# Patient Record
Sex: Male | Born: 1995 | Race: White | Hispanic: No | Marital: Married | Smoking: Never smoker
Health system: Southern US, Community
[De-identification: ages and names within clinical notes are randomized; demographics above are authoritative.]

## PROBLEM LIST (undated history)

## (undated) DIAGNOSIS — F909 Attention-deficit hyperactivity disorder, unspecified type: Secondary | ICD-10-CM

## (undated) HISTORY — DX: Attention-deficit hyperactivity disorder, unspecified type: F90.9

## (undated) HISTORY — PX: TONSILLECTOMY: SUR1361

---
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
## 2017-01-17 ENCOUNTER — Encounter: Payer: Self-pay | Admitting: Family

## 2017-01-17 ENCOUNTER — Other Ambulatory Visit: Payer: Self-pay | Admitting: Pediatrics

## 2017-01-17 ENCOUNTER — Ambulatory Visit (INDEPENDENT_AMBULATORY_CARE_PROVIDER_SITE_OTHER): Payer: 59 | Admitting: Family

## 2017-01-17 VITALS — BP 98/62 | HR 72 | Resp 16 | Ht 69.0 in | Wt 132.6 lb

## 2017-01-17 DIAGNOSIS — R278 Other lack of coordination: Secondary | ICD-10-CM | POA: Diagnosis not present

## 2017-01-17 DIAGNOSIS — F902 Attention-deficit hyperactivity disorder, combined type: Secondary | ICD-10-CM | POA: Diagnosis not present

## 2017-01-17 DIAGNOSIS — Z719 Counseling, unspecified: Secondary | ICD-10-CM | POA: Diagnosis not present

## 2017-01-17 DIAGNOSIS — Z79899 Other long term (current) drug therapy: Secondary | ICD-10-CM

## 2017-01-17 MED ORDER — METHYLPHENIDATE HCL ER (OSM) 27 MG PO TBCR: 27.0000 mg | EXTENDED_RELEASE_TABLET | Freq: Every day | ORAL | 0 refills | Status: DC

## 2017-01-17 MED ORDER — ATOMOXETINE HCL 60 MG PO CAPS: 60.0000 mg | ORAL_CAPSULE | Freq: Every day | ORAL | 2 refills | Status: DC

## 2017-01-17 NOTE — Progress Notes (Signed)
[facility] [address] [place] [address] Dept: [phone] Dept Fax: [phone] Loc: [phone] Loc Fax: [phone]  Medical Follow-up  Patient ID: Antonio PoppBrandon Rice, male  DOB: 10-18-1995, 21 y.o.  MRN: [id number]  Date of Evaluation: 01/17/17  PCP: Junie SpencerHawks, Christy A, FNP  Accompanied by: Mother Patient Lives with: parents and brother  HISTORY/CURRENT STATUS:  HPI  Patient here for routine follow up related to ADHD, Dysgraphia, and medication management. Patient here with mother for today's visit. Mother in waiiting room for the majority of the visit.  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 Did report one family trip and friends over the house often. Has taken his Concerta and Strattera all summer with no reported side effects at current doses.   EDUCATION: [employer] Year/Grade: Sophomore year  Take History, Math, 2 Skills and Technique classes, fundamentals of Brewing technologistlementary teaching Performance/Grades: average Services: Other: Disability services-extra time for testing Activities/Exercise: intermittently- most days with PE classes  MEDICAL HISTORY: Appetite: Good-not big eater, but does eat well when he eats.  MVI/Other: None Fruits/Vegs:Some Calcium: Some Iron:Some  Sleep: Time per night is about 8-10 hours/this summer on most nights.  Sleep Concerns: Initiation/Maintenance/Other: No problems reported by patient  Individual Medical History/Review of System Changes? None reported recently. Patient not sick or injured this summer per his report.   Allergies: Patient has no known allergies.  Current Medications:  Current Outpatient Prescriptions:  .  atomoxetine (STRATTERA) 60 MG capsule, Take 1 capsule (60 mg total) by mouth daily., Disp:  30 capsule, Rfl: 2 .  methylphenidate 27 MG PO CR tablet, Take 1 tablet (27 mg total) by mouth daily with breakfast. 03/18/17, Disp: 30 tablet, Rfl: 0 Medication Side Effects: None  Family Medical/Social History Changes?: None reported recently by patient. Brother is finished with Chemotherapy and finishing his radiation treatment. Support given.   MENTAL HEALTH: Mental Health Issues: No problems reported.   PHYSICAL EXAM: Vitals:  Today's Vitals   01/17/17 1138  BP: 98/62  Pulse: 72  Resp: 16  Weight: 132 lb 9.6 oz (60.1 kg)  Height: 5\' 9"  (1.753 m)  PainSc: 0-No pain  , 11 %ile (Z= -1.23) based on CDC 2-20 Years BMI-for-age data using vitals from 01/17/2017.  General Exam: Physical Exam  Constitutional: He is oriented to person, place, and time. He appears well-developed and well-nourished.  HENT:  Head: Normocephalic and atraumatic.  Right Ear: External ear normal.  Left Ear: External ear normal.  Nose: Nose normal.  Mouth/Throat: Oropharynx is clear and moist.  Eyes: Pupils are equal, round, and reactive to light. Conjunctivae and EOM are normal.  Neck: Trachea normal, normal range of motion and full passive range of motion without pain. Neck supple.  Cardiovascular: Normal rate, regular rhythm, normal heart sounds and intact distal pulses.   Pulmonary/Chest: Effort normal and breath sounds normal.  Abdominal: Soft. Bowel sounds are normal.  Genitourinary:  Genitourinary Comments: Deferred  Musculoskeletal: Normal range of motion.  Neurological: He is alert and oriented to person, place, and time. He has normal reflexes.  Skin: Skin is warm, dry and intact. Capillary refill takes less than 2 seconds.  Psychiatric: He has a normal mood and affect. His behavior is normal. Judgment and thought content normal.  Vitals reviewed.  Review of Systems  Psychiatric/Behavioral: Positive for decreased concentration.  All other systems reviewed and are negative.  No concerns for  toileting. Daily stool, no constipation or diarrhea. Void urine no difficulty. No enuresis.   Participate in daily oral hygiene to include brushing and flossing.  Neurological: oriented to time, place, and person Cranial Nerves: normal  Neuromuscular:  Motor Mass: Normal Tone: Normal Strength: Normal DTRs: 2+ and symmetric Overflow: None  Reflexes: no tremors noted Sensory Exam: Vibratory: Intact  Fine Touch: Intact  Testing/Developmental Screens: CGI:18/14 scored by patient and counseled at today's visit.      DIAGNOSES:    ICD-10-CM   1. ADHD (attention deficit hyperactivity disorder), combined type F90.2   2. Dysgraphia R27.8   3. Medication management Z79.899   4. Patient counseled Z71.9     RECOMMENDATIONS: 3 month follow up visit and counseled on medication. To continue with Strattera 60 mg daily, # 30 with 2 RF's, Concerta 27 mg daily, # 30 script printed. Three prescriptions provided, two with fill after dates for 02/17/17. and 03/19/17.  Information reviewed with patient regarding school schedule, classes for this semester and expectations from mother regarding grade performance.   Encouraged patient to be more involved socially at school with attendance to football or basketball games to show school spirit. Mother wanting to reinforce this without directing him to do it.   Counseled patient on physical activity and this semester will take several PE classes for this major along with intermural basketball at school.   Suggested better eating habits with a routine schedule once at school. Patient mostly eating 2 meals/day with snacking most of the day. Encouraged to eat healthy, high protein snacks.   Information received regarding brother's diagnosis, treatment, and prognosis with patient and mother. Support given to family.  Directed to f/u with PCP for routine care, dentist as needed, eye doctor for routine care, MVI daily, proper sleep, exercise, and healthy diet for  health maintenance.   NEXT APPOINTMENT: Return in about 3 months (around 04/19/2017) for follow up visit.   More than 50% of the appointment was spent counseling and discussing diagnosis and management of symptoms with the patient and family.  Carron Curie, NP Counseling Time: 30  mins Total Contact Time: 40 mins

## 2017-04-16 ENCOUNTER — Telehealth: Payer: Self-pay | Admitting: Family

## 2017-04-16 MED ORDER — METHYLPHENIDATE HCL ER (OSM) 27 MG PO TBCR: 27.0000 mg | EXTENDED_RELEASE_TABLET | Freq: Every day | ORAL | 0 refills | Status: DC

## 2017-04-16 NOTE — Telephone Encounter (Signed)
Printed Rx and placed at front desk for pick-up-Post dated for Concerta 27 mg daily 04/15/17 & 05/15/17.

## 2017-05-24 ENCOUNTER — Encounter: Payer: Self-pay | Admitting: Pediatrics

## 2017-05-24 ENCOUNTER — Ambulatory Visit: Payer: 59 | Admitting: Pediatrics

## 2017-05-24 VITALS — BP 120/80 | Ht 69.0 in | Wt 139.0 lb

## 2017-05-24 DIAGNOSIS — Z7189 Other specified counseling: Secondary | ICD-10-CM

## 2017-05-24 DIAGNOSIS — F902 Attention-deficit hyperactivity disorder, combined type: Secondary | ICD-10-CM

## 2017-05-24 DIAGNOSIS — R278 Other lack of coordination: Secondary | ICD-10-CM

## 2017-05-24 DIAGNOSIS — Z79899 Other long term (current) drug therapy: Secondary | ICD-10-CM

## 2017-05-24 DIAGNOSIS — Z719 Counseling, unspecified: Secondary | ICD-10-CM | POA: Diagnosis not present

## 2017-05-24 MED ORDER — ATOMOXETINE HCL 60 MG PO CAPS: 60.0000 mg | ORAL_CAPSULE | Freq: Every day | ORAL | 5 refills | Status: DC

## 2017-05-24 MED ORDER — METHYLPHENIDATE HCL ER (OSM) 27 MG PO TBCR: 27.0000 mg | EXTENDED_RELEASE_TABLET | Freq: Every day | ORAL | 0 refills | Status: DC

## 2017-05-24 NOTE — Patient Instructions (Addendum)
DISCUSSION: Patient and family counseled regarding the following coordination of care items:  Continue medication as directed Strattera 80 mg every morning RX for above e-scribed and sent to pharmacy on record  Concerta 27 mg every morning Three prescriptions provided, two with fill after dates for 06/14/2017 and 07/05/2017  Counseled medication administration, effects, and possible side effects.  ADHD medications discussed to include different medications and pharmacologic properties of each. Recommendation for specific medication to include dose, administration, expected effects, possible side effects and the risk to benefit ratio of medication management.  Advised importance of:  Good sleep hygiene (8- 10 hours per night) Limited screen time (none on school nights, no more than 2 hours on weekends) Regular exercise(outside and active play) Healthy eating (drink water, no sodas/sweet tea, limit portions and no seconds).  Counseling at this visit included the review of old records and/or current chart with the patient and family.   Counseling included the following discussion points:  Recent health history and today's examination Growth and development with anticipatory guidance provided regarding brain growth, executive function maturation and pubertal development School progress and continued advocay for appropriate accommodations to include maintain Structure, routine, organization, reward, motivation and consequences.  Additionally discussed need for counseling with regard to family situation. Please reach out to hospice to coordinate counseling for family.

## 2017-05-24 NOTE — Progress Notes (Signed)
[facility] [address], [address] [place] [address] Dept: [phone] Dept Fax: [phone] Loc: [phone] Loc Fax: [phone]  Medical Follow-up  Patient ID: Antonio PoppBrandon Rice, male  DOB: [birth year], 21 y.o.  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 Came up spine, lost weight and may be in hospice.  Father is now in separate home about four months ago. Antonio RuskJeremy (17 years) is a family friend and living with Dad.  Antonio JunesBrandon feels he has a good relationship with him and can talk to him about stuff.  Is home from college now, and is mostly at [employer]'s house.  HISTORY/CURRENT STATUS:  Chief Complaint - Polite and cooperative and present for medical follow up for medication management of ADHD, dysgraphia and learning differences.  Strattera 80 mg, concerta 27 mg, take both daily.    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  MEDICAL HISTORY: Appetite: WNL  Sleep: Bedtime: good sleep Awakens: good Some late nights at home and waking up later Sleep Concerns: Initiation/Maintenance/Other: Asleep easily, sleeps  through the night, feels well-rested.  No Sleep concerns. No concerns for toileting. Daily stool, no constipation or diarrhea. Void urine no difficulty. No enuresis.   Participate in daily oral hygiene to include brushing and flossing.  Individual Medical History/Review of System Changes? No  Allergies: Patient has no known allergies.  Current Medications:   Strattera 80 mg Concerta 27 mg Daily medication  Medication Side Effects: None  Family Medical/Social History Changes?: Yes brother now in hospice care at home for recurring sarcoma that returned late September early October. Mother is taking care of brother and is still trying to work. Father is a mess.    MENTAL HEALTH: Mental Health Issues:  Very stressed with the family situation of the father being away and the brother with terminal cancer.  Denies sadness, loneliness or depression.  No self harm or thoughts of self harm or injury. Denies fears, worries and anxieties. Has good peer relations and is not a bully nor is victimized.  Review of Systems  Constitutional: Negative.   HENT: Negative.   Eyes: Negative.   Respiratory: Negative.   Cardiovascular: Negative.   Gastrointestinal: Negative.   Endocrine: Negative.   Genitourinary: Negative.   Musculoskeletal: Negative.   Allergic/Immunologic: Negative.   Neurological: Negative for seizures and headaches.  Hematological: Negative.   Psychiatric/Behavioral: Positive for dysphoric mood. Negative for behavioral problems, decreased concentration and sleep disturbance. The patient is not nervous/anxious and is not hyperactive.     PHYSICAL EXAM: Vitals:  Today's Vitals   05/24/17 1409  BP: 120/80  Weight: 139 lb (63 kg)  Height: 5\' 9"  (1.753 m)  , 19 %ile (Z= -0.86) based on CDC (Boys, 2-20  Years) BMI-for-age based on BMI available as of 05/24/2017. Body mass index is 20.53 kg/m.  General Exam: Physical Exam  Constitutional: He is oriented to person, place,  and time. Vital signs are normal. He appears well-developed and well-nourished. He is cooperative. No distress.  HENT:  Head: Normocephalic.  Right Ear: Tympanic membrane and ear canal normal.  Left Ear: Tympanic membrane and ear canal normal.  Nose: Nose normal.  Mouth/Throat: Uvula is midline, oropharynx is clear and moist and mucous membranes are normal.  Eyes: Conjunctivae, EOM and lids are normal. Pupils are equal, round, and reactive to light.  Neck: Normal range of motion. Neck supple. No thyromegaly present.  Cardiovascular: Normal rate, regular rhythm and intact distal pulses.  Pulmonary/Chest: Effort normal and breath sounds normal.  Abdominal: Soft. Normal appearance.  Genitourinary:  Genitourinary Comments: Deferred  Musculoskeletal: Normal range of motion.  Neurological: He is alert and oriented to person, place, and time. He has normal strength and normal reflexes. He displays no tremor. No cranial nerve deficit or sensory deficit. He exhibits normal muscle tone. He displays a negative Romberg sign. He displays no seizure activity. Coordination and gait normal.  Skin: Skin is warm, dry and intact.  Psychiatric: He has a normal mood and affect. His speech is normal and behavior is normal. Judgment and thought content normal. His mood appears not anxious. His affect is not inappropriate. He is not agitated, not aggressive and not hyperactive. Cognition and memory are normal. He does not express impulsivity or inappropriate judgment. He expresses no suicidal ideation. He expresses no suicidal plans. He is attentive.  Vitals reviewed.  Neurological: oriented to place and person  Testing/Developmental Screens: CGI:19/11  Reviewed with patient and mother     DIAGNOSES:    ICD-10-CM   1. ADHD (attention deficit hyperactivity disorder), combined type F90.2   2. Dysgraphia R27.8   3. Medication management Z79.899   4. Counseling and coordination of care Z71.89   5. Patient  counseled Z71.9   6. Parenting dynamics counseling Z71.89     RECOMMENDATIONS:  Patient Instructions  DISCUSSION: Patient and family counseled regarding the following coordination of care items:  Continue medication as directed Strattera 80 mg every morning RX for above e-scribed and sent to pharmacy on record  Concerta 27 mg every morning Three prescriptions provided, two with fill after dates for 06/14/2017 and 07/05/2017  Counseled medication administration, effects, and possible side effects.  ADHD medications discussed to include different medications and pharmacologic properties of each. Recommendation for specific medication to include dose, administration, expected effects, possible side effects and the risk to benefit ratio of medication management.  Advised importance of:  Good sleep hygiene (8- 10 hours per night) Limited screen time (none on school nights, no more than 2 hours on weekends) Regular exercise(outside and active play) Healthy eating (drink water, no sodas/sweet tea, limit portions and no seconds).  Counseling at this visit included the review of old records and/or current chart with the patient and family.   Counseling included the following discussion points:  Recent health history and today's examination Growth and development with anticipatory guidance provided regarding brain growth, executive function maturation and pubertal development School progress and continued advocay for appropriate accommodations to include maintain Structure, routine, organization, reward, motivation and consequences.  Additionally discussed need for counseling with regard to family situation. Please reach out to hospice to coordinate counseling for family.    NEXT APPOINTMENT: Return in about 6 months (around 11/22/2017) for Medical Follow up.  Medical Decision-making: More than 50% of the appointment was spent counseling and discussing diagnosis and management of symptoms  with the patient and family.   Leticia Penna, NP Counseling Time: 40 Total Contact Time: 50

## 2017-06-20 ENCOUNTER — Ambulatory Visit (INDEPENDENT_AMBULATORY_CARE_PROVIDER_SITE_OTHER): Payer: 59

## 2017-06-20 ENCOUNTER — Encounter: Payer: Self-pay | Admitting: Family Medicine

## 2017-06-20 ENCOUNTER — Ambulatory Visit: Payer: 59 | Admitting: Family Medicine

## 2017-06-20 VITALS — BP 111/72 | HR 107 | Temp 98.7°F | Ht 69.0 in | Wt 139.0 lb

## 2017-06-20 DIAGNOSIS — S6990XA Unspecified injury of unspecified wrist, hand and finger(s), initial encounter: Secondary | ICD-10-CM

## 2017-06-20 DIAGNOSIS — T148XXA Other injury of unspecified body region, initial encounter: Secondary | ICD-10-CM | POA: Diagnosis not present

## 2017-06-20 DIAGNOSIS — S6991XA Unspecified injury of right wrist, hand and finger(s), initial encounter: Secondary | ICD-10-CM

## 2017-06-20 NOTE — Progress Notes (Signed)
Subjective: CC: hand injury PCP: Junie SpencerHawks, Christy A, FNP ZOX:WRUEAVWHPI:Antonio Rice is a 22 y.o. male presenting to clinic today for:  1. Hand injury Patient reports that he hyperextended his middle finger on the right hand about 2 weeks ago.  He notes swelling and pain during that time.  He wrapped his hand and Ace bandage but continued to play and exercise.  He notes that he continues to have pain particularly with bowling up his fist.  He noticed some swelling over the joint in his middle finger.  No numbness, tingling.  No discoloration.  He is not taking any medications for this at this time.  Of note, he is taking a swim class and is worried that he may cause further injury to the hand if he swims.   ROS: Per HPI  No Known Allergies Past Medical History:  Diagnosis Date  . ADHD (attention deficit hyperactivity disorder)     Current Outpatient Medications:  .  atomoxetine (STRATTERA) 60 MG capsule, Take 1 capsule (60 mg total) by mouth daily., Disp: 30 capsule, Rfl: 5 .  methylphenidate 27 MG PO CR tablet, Take 1 tablet (27 mg total) by mouth daily with breakfast. 05/15/17, Disp: 30 tablet, Rfl: 0 Social History   Socioeconomic History  . Marital status: Single    Spouse name: Not on file  . Number of children: Not on file  . Years of education: Not on file  . Highest education level: Not on file  Social Needs  . Financial resource strain: Not on file  . Food insecurity - worry: Not on file  . Food insecurity - inability: Not on file  . Transportation needs - medical: Not on file  . Transportation needs - non-medical: Not on file  Occupational History  . Not on file  Tobacco Use  . Smoking status: Never Smoker  . Smokeless tobacco: Never Used  Substance and Sexual Activity  . Alcohol use: No    Alcohol/week: 0.0 oz  . Drug use: No  . Sexual activity: Not Currently  Other Topics Concern  . Not on file  Social History Narrative  . Not on file   Family History  Problem  Relation Age of Onset  . Cancer Brother 17       undifferentiated epitheliod malignancy high grade sarcoma    Objective: Office vital signs reviewed. BP 111/72   Pulse (!) 107   Temp 98.7 F (37.1 C) (Oral)   Ht 5\' 9"  (1.753 m)   Wt 139 lb (63 kg)   BMI 20.53 kg/m   Physical Examination:  General: Awake, alert, well nourished, No acute distress MSK: No erythema.  Patient has decreased active range of motion secondary to pain in flexion of the third digit in the right hand.  There is mild swelling noted near the PIP joint.  There is tenderness to the distal aspect of the third metacarpal near the joint.   Extremities: brisk capillary refill noted. Neuro: Light touch sensation grossly intact.  Minimal weakness appreciated with resisted flexion of the third digit on the right hand.  Dg Hand Complete Right  Result Date: 06/20/2017 CLINICAL DATA:  Injury while playing basketball EXAM: RIGHT HAND - COMPLETE 3+ VIEW COMPARISON:  None. FINDINGS: Frontal, oblique, and lateral views were obtained. There is an avulsion along the dorsal, medial aspect of the third distal metacarpal with alignment near anatomic. No other evident fracture. No dislocation. Joint spaces appear normal. No erosive change. IMPRESSION: Avulsion along the dorsal, medial  aspect of the third distal metacarpal with alignment near anatomic. No other fracture. No dislocation. No evident arthropathy. These results will be called to the ordering clinician or representative by the Radiologist Assistant, and communication documented in the PACS or zVision Dashboard. Electronically Signed   By: Bretta Bang III M.D.   On: 06/20/2017 15:15   Assessment/ Plan: 22 y.o. male   1. Injury of hand, unspecified laterality, initial encounter Physical exam notable for mild swelling at the PIP joint of the right hand and the third digit.  X-ray was obtained which did reveal a nondisplaced avulsion fracture along the distal third  metacarpal.  This does not appear to involve the joint and seems localized to the edge of the metacarpal head.  Patient was placed in a wrist brace today, with 30 degrees of extension.  He was referred to orthopedics for further evaluation and management.  I suspect this will likely be able to be managed with bracing.  I did write a note to his school to excuse him from swimming for now.  I recommended that he avoid impact activities at this time.  He may use ice and ibuprofen if needed for pain or swelling. - DG Hand Complete Right; Future - Ambulatory referral to Orthopedic Surgery  2. Avulsion fracture - Ambulatory referral to Orthopedic Surgery   Orders Placed This Encounter  Procedures  . DG Hand Complete Right    Standing Status:   Future    Number of Occurrences:   1    Standing Expiration Date:   08/19/2018    Order Specific Question:   Reason for Exam (SYMPTOM  OR DIAGNOSIS REQUIRED)    Answer:   injury    Order Specific Question:   Preferred imaging location?    Answer:   Internal    Order Specific Question:   Radiology Contrast Protocol - do NOT remove file path    Answer:   [email or web address]  . Ambulatory referral to Orthopedic Surgery    Referral Priority:   Urgent    Referral Type:   Surgical    Referral Reason:   Specialty Services Required    Requested Specialty:   Orthopedic Surgery    Number of Visits Requested:   1   No orders of the defined types were placed in this encounter.    Raliegh Ip, DO [facility] [phone]

## 2017-07-02 DIAGNOSIS — S62309A Unspecified fracture of unspecified metacarpal bone, initial encounter for closed fracture: Secondary | ICD-10-CM | POA: Insufficient documentation

## 2017-08-29 ENCOUNTER — Encounter: Payer: Self-pay | Admitting: Pediatrics

## 2017-09-09 ENCOUNTER — Other Ambulatory Visit: Payer: Self-pay

## 2017-09-09 MED ORDER — METHYLPHENIDATE HCL ER (OSM) 27 MG PO TBCR: 27.0000 mg | EXTENDED_RELEASE_TABLET | Freq: Every day | ORAL | 0 refills | Status: DC

## 2017-09-09 NOTE — Telephone Encounter (Signed)
Spoke with mom and informed her that we cant escribe additional RX to pharmacy but if she needs more refills to call and let her know and also let mom know that she can pick up meds from pharmacy

## 2017-09-09 NOTE — Telephone Encounter (Signed)
Mom called for refills for Concerta, needs refills for April and May patient is in college. Last visit 05/24/2017 next visit 10/18/2017. [facility] [place].

## 2017-09-09 NOTE — Telephone Encounter (Signed)
RX for above e-scribed and sent to pharmacy on record  [facility] - [address] [place] [place] [address] Phone: [phone] Fax: [phone]  I will request CMA call mother and tell her that we are not able to escribe additional RX.  We can print and provide paper or patient can call and we can escribe to another pharmacy for additional RX.

## 2017-10-16 ENCOUNTER — Other Ambulatory Visit: Payer: Self-pay

## 2017-10-16 MED ORDER — METHYLPHENIDATE HCL ER (OSM) 27 MG PO TBCR: 27.0000 mg | EXTENDED_RELEASE_TABLET | Freq: Every day | ORAL | 0 refills | Status: DC

## 2017-10-16 NOTE — Telephone Encounter (Signed)
Mom called in for refill for Methylphenidate . Last visit 06/20/2017 next visit 11/13/2017. Please escribe to [facility] in [place], [place]

## 2017-10-16 NOTE — Telephone Encounter (Signed)
RX for above e-scribed and sent to pharmacy on record  [facility] [place] - [address] [place] [address] Phone: [phone] Fax: [phone]    

## 2017-10-18 ENCOUNTER — Institutional Professional Consult (permissible substitution): Payer: 59 | Admitting: Pediatrics

## 2017-11-06 ENCOUNTER — Ambulatory Visit: Payer: 59 | Admitting: Family Medicine

## 2017-11-06 ENCOUNTER — Encounter: Payer: Self-pay | Admitting: Family Medicine

## 2017-11-06 VITALS — BP 117/77 | HR 112 | Temp 99.0°F | Ht >= 80 in | Wt 134.0 lb

## 2017-11-06 DIAGNOSIS — M25571 Pain in right ankle and joints of right foot: Secondary | ICD-10-CM | POA: Diagnosis not present

## 2017-11-06 NOTE — Progress Notes (Signed)
Subjective: CC: Right ankle  PCP: Antonio Spencer, FNP ZOX:WRUEAVW [name] is a 22 y.o. male presenting to clinic today for:  1. Right ankle pain Patient reports acute onset of right ankle pain yesterday evening while playing basketball.  He notes that he was going in for a lay up when he hyperextended and inverted the right foot.  He reports hearing an audible pop and has since had quite a bit of pain and swelling in the right lateral ankle.  He notes discoloration.  He reports pain with ambulation.  No numbness or tingling.     ROS: Per HPI  No Known Allergies Past Medical History:  Diagnosis Date  . ADHD (attention deficit hyperactivity disorder)     Current Outpatient Medications:  .  atomoxetine (STRATTERA) 60 MG capsule, Take 1 capsule (60 mg total) by mouth daily., Disp: 30 capsule, Rfl: 5 .  methylphenidate 27 MG PO CR tablet, Take 1 tablet (27 mg total) by mouth daily with breakfast., Disp: 30 tablet, Rfl: 0 Social History   Socioeconomic History  . Marital status: Single    Spouse name: Not on file  . Number of children: Not on file  . Years of education: Not on file  . Highest education level: Not on file  Occupational History  . Not on file  Social Needs  . Financial resource strain: Not on file  . Food insecurity:    Worry: Not on file    Inability: Not on file  . Transportation needs:    Medical: Not on file    Non-medical: Not on file  Tobacco Use  . Smoking status: Never Smoker  . Smokeless tobacco: Never Used  Substance and Sexual Activity  . Alcohol use: No    Alcohol/week: 0.0 oz  . Drug use: No  . Sexual activity: Not Currently  Lifestyle  . Physical activity:    Days per week: Not on file    Minutes per session: Not on file  . Stress: Not on file  Relationships  . Social connections:    Talks on phone: Not on file    Gets together: Not on file    Attends religious service: Not on file    Active member of club or organization: Not on  file    Attends meetings of clubs or organizations: Not on file    Relationship status: Not on file  . Intimate partner violence:    Fear of current or ex partner: Not on file    Emotionally abused: Not on file    Physically abused: Not on file    Forced sexual activity: Not on file  Other Topics Concern  . Not on file  Social History Narrative  . Not on file   Family History  Problem Relation Age of Onset  . Cancer Brother 17       undifferentiated epitheliod malignancy high grade sarcoma    Objective: Office vital signs reviewed. BP 117/77   Pulse (!) 112   Temp 99 F (37.2 C) (Oral)   Ht 7' (2.134 m)   Wt 134 lb (60.8 kg)   BMI 13.35 kg/m   Physical Examination:  General: Awake, alert, well nourished, well appearing male, No acute distress MSK:   Right ankle: Patient has a large amount of soft tissue swelling with palpable fluctuance along the right dorso lateral aspect of the ankle.  He is exquisitely tender to minimal palpation over the ATF.  He has substantial pain with inversion of  the foot.  No pain with eversion of the foot.  No substantial ligamentous laxity appreciated with anterior drawer testing. Skin: Slight ecchymosis of the foot appreciated. Neuro: Light touch sensation grossly intact.  Assessment/ Plan: 22 y.o. male   1. Acute right ankle pain Concern for possible ATF tear given "popping".  We discussed obtaining imaging studies here versus orthopedics.  Mother would like to defer to orthopedic since we are able to secure an appointment today at 2:15 PM. - Ambulatory referral to Orthopedic Surgery   Orders Placed This Encounter  Procedures  . Ambulatory referral to Orthopedic Surgery    Referral Priority:   Routine    Referral Type:   Surgical    Referral Reason:   Specialty Services Required    Requested Specialty:   Orthopedic Surgery    Number of Visits Requested:   1      Antonio Zwicker Hulen Skains, DO [facility] [phone]

## 2017-11-06 NOTE — Patient Instructions (Signed)
I have placed a referral to your orthopedist.  I think that this is likely a bad sprain but given your "popping sensation" and exquisite swelling, I cannot rule out a partial tear of the ATF.  Ankle Sprain An ankle sprain is a stretch or tear in one of the tough, fiber-like tissues (ligaments) in the ankle. The ligaments in your ankle help to hold the bones of the ankle together. What are the causes? This condition is often caused by stepping on or falling on the outer edge of the foot. What increases the risk? This condition is more likely to develop in people who play sports. What are the signs or symptoms? Symptoms of this condition include:  Pain in your ankle.  Swelling.  Bruising. Bruising may develop right after you sprain your ankle or 1-2 days later.  Trouble standing or walking, especially when you turn or change directions.  How is this diagnosed? This condition is diagnosed with a physical exam. During the exam, your health care provider will press on certain parts of your foot and ankle and try to move them in certain ways. X-rays may be taken to see how severe the sprain is and to check for broken bones. How is this treated? This condition may be treated with:  A brace. This is used to keep the ankle from moving until it heals.  An elastic bandage. This is used to support the ankle.  Crutches.  Pain medicine.  Surgery. This may be needed if the sprain is severe.  Physical therapy. This may help to improve the range of motion in the ankle.  Follow these instructions at home:  Rest your ankle.  Take over-the-counter and prescription medicines only as told by your health care provider.  For 2-3 days, keep your ankle raised (elevated) above the level of your heart as much as possible.  If directed, apply ice to the area: ? Put ice in a plastic bag. ? Place a towel between your skin and the bag. ? Leave the ice on for 20 minutes, 2-3 times a day.  If you were  given a brace: ? Wear it as directed. ? Remove it to shower or bathe. ? Try not to move your ankle much, but wiggle your toes from time to time. This helps to prevent swelling.  If you were given an elastic bandage (dressing): ? Remove it to shower or bathe. ? Try not to move your ankle much, but wiggle your toes from time to time. This helps to prevent swelling. ? Adjust the dressing to make it more comfortable if it feels too tight. ? Loosen the dressing if you have numbness or tingling in your foot, or if your foot becomes cold and blue.  If you have crutches, use them as told by your health care provider. Continue to use them until you can walk without feeling pain in your ankle. Contact a health care provider if:  You have rapidly increasing bruising or swelling.  Your pain is not relieved with medicine. Get help right away if:  Your toes or foot becomes numb or blue.  You have severe pain that gets worse. This information is not intended to replace advice given to you by your health care provider. Make sure you discuss any questions you have with your health care provider. Document Released: 05/28/2005 Document Revised: 07/05/2016 Document Reviewed: 12/28/2014 Elsevier Interactive Patient Education  [employer].

## 2017-11-13 ENCOUNTER — Ambulatory Visit: Payer: 59 | Admitting: Pediatrics

## 2017-11-13 ENCOUNTER — Encounter: Payer: Self-pay | Admitting: Pediatrics

## 2017-11-13 VITALS — BP 115/77 | HR 78 | Ht 70.5 in | Wt 140.0 lb

## 2017-11-13 DIAGNOSIS — R278 Other lack of coordination: Secondary | ICD-10-CM

## 2017-11-13 DIAGNOSIS — Z79899 Other long term (current) drug therapy: Secondary | ICD-10-CM

## 2017-11-13 DIAGNOSIS — Z7189 Other specified counseling: Secondary | ICD-10-CM | POA: Diagnosis not present

## 2017-11-13 DIAGNOSIS — F902 Attention-deficit hyperactivity disorder, combined type: Secondary | ICD-10-CM | POA: Diagnosis not present

## 2017-11-13 DIAGNOSIS — Z719 Counseling, unspecified: Secondary | ICD-10-CM

## 2017-11-13 MED ORDER — METHYLPHENIDATE HCL ER (OSM) 27 MG PO TBCR: 27.0000 mg | EXTENDED_RELEASE_TABLET | Freq: Every day | ORAL | 0 refills | Status: DC

## 2017-11-13 MED ORDER — ATOMOXETINE HCL 60 MG PO CAPS: 60.0000 mg | ORAL_CAPSULE | Freq: Every day | ORAL | 5 refills | Status: DC

## 2017-11-13 NOTE — Patient Instructions (Addendum)
DISCUSSION: Patient and family counseled regarding the following coordination of care items:  Continue medication as directed Concerta 27 mg every morning Strattera 60 mg every morning RX for above e-scribed and sent to pharmacy on record  [facility] [place] - [address] [place] [place] [address] Phone: [phone] Fax: [phone]  Counseled medication administration, effects, and possible side effects.  ADHD medications discussed to include different medications and pharmacologic properties of each. Recommendation for specific medication to include dose, administration, expected effects, possible side effects and the risk to benefit ratio of medication management.  Advised importance of:  Good sleep hygiene (8- 10 hours per night) Limited screen time (none on school nights, no more than 2 hours on weekends) Regular exercise(outside and active play) Healthy eating (drink water, no sodas/sweet tea, limit portions and no seconds).  Counseling at this visit included the review of old records and/or current chart with the patient and family.   Counseling included the following discussion points presented at every visit to improve understanding and treatment compliance.  Recent health history and today's examination Growth and development with anticipatory guidance provided regarding brain growth, executive function maturation and pubertal development School progress and continued advocay for appropriate accommodations to include maintain Structure, routine, organization, reward, motivation and consequences.  Additionally the patient was counseled to take medication while driving.

## 2017-11-13 NOTE — Progress Notes (Signed)
[facility] [place] [facility] [address], [address] [place] [address] Dept: [phone] Dept Fax: [phone] Loc: [phone] Loc Fax: [phone]  Medical Follow-up  Patient ID: Antonio PoppBrandon Dondero, male  DOB: January 01, 1996, 22 y.o.  MRN: [id number]  Date of Evaluation: 11/13/17   PCP: Junie SpencerHawks, Christy A, FNP  Accompanied by: Mother Patient Lives with: mother and father  Rotating weeks with parents Brother died in December from cancer at at 17 years, just before Christmas  Father lives with friend Riki RuskJeremy (17) Mother lives alone, mother considering moving from home where her son had passed  HISTORY/CURRENT STATUS:  Chief Complaint - Polite and cooperative and present for medical follow up for medication management of ADHD, dysgraphia and learning differences. Last follow up December 2018, currently prescribed Concerta 27 mg every morning, and Strattera 60 mg daily.  Recent basketball injury to ankle right and in boot. No pain today. No counseling, denies he needs grief counsel due to brother's passing in December at age 22 years from cancer.  Not sure if parents are in counseling and "they don't talk about it".   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 took: Hotel managerwimming (A), leisure activities (B), Eng (B), secondary ed (B) and psychology (F), intro to athletic health care (A)  Will take: Intro to Agricultural consultantTeaching, Public house managerducational Tech, Administrator, Civil Servicewater management and ecology,  history, Understanding music, Sociology  Wants to do physical education  Outside and intramural basket ball On off church Was working at [employer] - not working due to [employer]me[employer] Corporationboot, will take off one month [place]  MEDICAL HISTORY: Appetite: WNL  Sleep: Bedtime: 2200-2300 Awakens: 0800 to 0900 Sleep Concerns: Initiation/Maintenance/Other: Asleep easily, sleeps  through the night, feels well-rested.  No Sleep concerns. No concerns for toileting. Daily stool, no constipation or diarrhea. Void urine no difficulty. No enuresis.   Participate in daily oral hygiene to include brushing and flossing.  Individual Medical History/Review of System Changes? Yes ortho and urgent care for thumb in care door  Allergies: Patient has no known allergies.  Current Medications:  Strattera 60 mg Concerta 27 mg daily Medication Side Effects: None  Family Medical/Social History Changes?: Yes brother's death in December  MENTAL HEALTH: Denies sadness, loneliness or depression. No self harm or thoughts of self harm or injury. Denies fears, worries and anxieties. Has good peer relations and is not a bully nor is victimized.  PHYSICAL EXAM: Vitals:  Today's Vitals   11/13/17 1057  BP: 115/77  Pulse: 78  Weight: 140 lb (63.5 kg)  Height: 5' 10.5" (1.791 m)  , Facility age limit for growth percentiles is 20 years.  General Exam: Physical Exam  Constitutional: He is oriented to person, place, and time. Vital signs are normal. He appears well-developed and well-nourished. He is cooperative. No distress.  HENT:  Head: Normocephalic.  Right Ear: Tympanic membrane and ear canal normal.  Left Ear: Tympanic membrane and ear canal normal.  Nose: Nose normal.  Mouth/Throat: Uvula is midline, oropharynx is clear and moist and mucous membranes are normal.  Eyes: Pupils are equal, round, and reactive to light. Conjunctivae, EOM and lids are normal.  Neck: Normal range of motion. Neck supple. No thyromegaly present.  Cardiovascular: Normal rate, regular rhythm and intact distal pulses.  Pulmonary/Chest: Effort normal and breath sounds normal.  Abdominal: Soft. Normal appearance.  Genitourinary:  Genitourinary Comments: Deferred  Musculoskeletal: Normal range of motion.  Neurological: He is alert and oriented to person, place, and time. He has normal strength and  normal reflexes. He displays no tremor. No cranial nerve deficit or sensory deficit. He exhibits normal muscle tone. He displays a negative Romberg sign. He displays no seizure activity. Coordination and gait normal.  Skin: Skin is warm, dry and intact.  Psychiatric: He has a normal mood and affect. His speech is normal and behavior is normal. Judgment and thought content normal. His mood appears not anxious. His affect is not inappropriate. He is not agitated, not aggressive and not hyperactive. Cognition and memory are normal. He does not express impulsivity or inappropriate judgment. He expresses no suicidal ideation. He expresses no suicidal plans. He is attentive.  Vitals reviewed.   Neurological: oriented to place and person  Testing/Developmental Screens: CGI:15/11  Reviewed with patient and mother    DIAGNOSES:    ICD-10-CM   1. ADHD (attention deficit hyperactivity disorder), combined type F90.2   2. Dysgraphia R27.8   3. Medication management Z79.899   4. Patient counseled Z71.9   5. Parenting dynamics counseling Z71.89   6. Counseling and coordination of care Z71.89     RECOMMENDATIONS:  Patient Instructions  DISCUSSION: Patient and family counseled regarding the following coordination of care items:  Continue medication as directed Concerta 27 mg every morning Strattera 60 mg every morning RX for above e-scribed and sent to pharmacy on record  [facility] [place], [place] - [place], [place] - [address] [place] [address] Phone: [phone] Fax: [phone]  Counseled medication administration, effects, and possible side effects.  ADHD medications discussed to include different medications and pharmacologic properties of each. Recommendation for specific medication to include dose, administration, expected effects, possible side effects and the risk to benefit ratio of medication management.  Advised importance of:  Good sleep  hygiene (8- 10 hours per night) Limited screen time (none on school nights, no more than 2 hours on weekends) Regular exercise(outside and active play) Healthy eating (drink water, no sodas/sweet tea, limit portions and no seconds).  Counseling at this visit included the review of old records and/or current chart with the patient and family.   Counseling included the following discussion points presented at every visit to improve understanding and treatment compliance.  Recent health history and today's examination Growth and development with anticipatory guidance provided regarding brain growth, executive function maturation and pubertal development School progress and continued advocay for appropriate accommodations to include maintain Structure, routine, organization, reward, motivation and consequences.  Additionally the patient was counseled to take medication while driving.  Mother verbalized understanding of all topics discussed.   NEXT APPOINTMENT: Return in about 6 months (around 05/15/2018) for Medical Follow up. Medical Decision-making: More than 50% of the appointment was spent counseling and discussing diagnosis and management of symptoms with the patient and family.   Leticia Penna, NP Counseling Time: 40 Total Contact Time: 50

## 2017-12-10 ENCOUNTER — Other Ambulatory Visit: Payer: Self-pay | Admitting: Pediatrics

## 2017-12-10 NOTE — Telephone Encounter (Signed)
Last visit 11/13/2017 next visit 02/26/2018 

## 2018-01-15 ENCOUNTER — Other Ambulatory Visit: Payer: Self-pay | Admitting: Pediatrics

## 2018-01-15 NOTE — Telephone Encounter (Signed)
Last visit 11/13/2017 next visit 02/26/2018 

## 2018-01-16 NOTE — Telephone Encounter (Signed)
RX for above e-scribed and sent to pharmacy on record  [facility], [place] - [place], [place] - [address] [place] [address] Phone: [phone] Fax: [phone]    

## 2018-02-06 ENCOUNTER — Other Ambulatory Visit: Payer: Self-pay | Admitting: Pediatrics

## 2018-02-06 NOTE — Telephone Encounter (Signed)
Last visit 11/13/2017 next visit 02/26/2018

## 2018-02-07 NOTE — Telephone Encounter (Signed)
Concerta 27 mg daily, # 30 with no refills. RX for above e-scribed and sent to pharmacy on record  [facility], [place] - [place], [place] - [address] [place] [address] Phone: [phone] Fax: [phone]    

## 2018-02-26 ENCOUNTER — Institutional Professional Consult (permissible substitution): Payer: 59 | Admitting: Pediatrics

## 2018-03-11 ENCOUNTER — Other Ambulatory Visit: Payer: Self-pay | Admitting: Family

## 2018-03-11 NOTE — Telephone Encounter (Signed)
RX for above e-scribed and sent to pharmacy on record  [facility], [place] - [place], [place] - [address] [place] [address] Phone: [phone] Fax: [phone]    

## 2018-04-09 ENCOUNTER — Other Ambulatory Visit: Payer: Self-pay | Admitting: Pediatrics

## 2018-05-12 ENCOUNTER — Other Ambulatory Visit: Payer: Self-pay | Admitting: Pediatrics

## 2018-05-12 NOTE — Telephone Encounter (Signed)
Last visit 11/13/2017 next visit 05/22/2018

## 2018-05-12 NOTE — Telephone Encounter (Signed)
E-Prescribed Concerta 27 mg directly to  [facility] [place] - [address] [place] [address] Phone: [phone] Fax: [phone]

## 2018-05-22 ENCOUNTER — Encounter: Payer: Self-pay | Admitting: Pediatrics

## 2018-05-22 ENCOUNTER — Ambulatory Visit: Payer: 59 | Admitting: Pediatrics

## 2018-05-22 VITALS — BP 109/79 | HR 88 | Ht 69.5 in | Wt 138.0 lb

## 2018-05-22 DIAGNOSIS — Z79899 Other long term (current) drug therapy: Secondary | ICD-10-CM

## 2018-05-22 DIAGNOSIS — Z719 Counseling, unspecified: Secondary | ICD-10-CM

## 2018-05-22 DIAGNOSIS — F902 Attention-deficit hyperactivity disorder, combined type: Secondary | ICD-10-CM | POA: Diagnosis not present

## 2018-05-22 DIAGNOSIS — R278 Other lack of coordination: Secondary | ICD-10-CM | POA: Diagnosis not present

## 2018-05-22 DIAGNOSIS — Z7189 Other specified counseling: Secondary | ICD-10-CM

## 2018-05-22 NOTE — Progress Notes (Signed)
Medication Check  Patient ID: Antonio Rice  DOB: [birth year] MRN: [id number]  DATE:05/22/18 Junie Spencer, FNP  Accompanied by: Mother Patient Lives with: mother and father switches every week when home from school At school lives with roommate [name] on [address] in suites, has two suitemates.  Suite is two rooms, one bathroom and no common area/no living room Father living alone now. Mother has new house.  HISTORY/CURRENT STATUS: Chief Complaint - Polite and cooperative and present for medical follow up for medication management of ADHD, dysgraphia and learning differences. Last follow up June 2019 and currently prescribed Strattera 60 mg and Concerta 27 mg, takes both daily.  Currently finished sophomore first semester at [employer] and is not going back.  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 Parents do support his decision.  Has completely moved out and has considered [employer], but is still "researching" all of it.  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 Has car at school and was home every weekend.  MEDICAL HISTORY: Appetite: At schoolHas meal plan, goes to grill at school.  Takes snacks in AM, lunch around 1200 - burger, sandwiches, tender. Sometimes will snack in afternoon, not eating dinner, may go out with family or friends if in town, but not often. At home - first meal at lunch - sandwich or what he makes himself - snacks in between and then dinner - out with Dad, mom cooks some   Sleep: Bedtime: 2300 to 2400  Awakens: up usually around 1000   Concerns: Initiation/Maintenance/Other: Asleep easily, sleeps through the night, feels well-rested.  No Sleep concerns. No concerns for toileting. Daily stool, no constipation or  diarrhea. Void urine no difficulty. No enuresis.   Participate in daily oral hygiene to include brushing and flossing.  Individual Medical History/ Review of Systems: Changes? :No  Family Medical/ Social History: Changes? Year anniversary of brothers passing. Mother moved to new house in [place]  Current Medications:  Strattera 60 mg Concerta 27 mg  Medication Side Effects: None  MENTAL HEALTH: Mental Health Issues:   Denies sadness, loneliness or depression. No self harm or thoughts of self harm or injury. Denies fears, worries and anxieties. Has good peer relations and is not a bully nor is victimized.  Review of Systems  Constitutional: Negative.   HENT: Negative.   Eyes: Negative.   Respiratory: Negative.   Cardiovascular: Negative.   Gastrointestinal: Negative.   Endocrine: Negative.   Genitourinary: Negative.   Musculoskeletal: Negative.   Allergic/Immunologic: Negative.   Neurological: Negative for seizures and headaches.  Hematological: Negative.   Psychiatric/Behavioral: Positive for dysphoric mood. Negative for behavioral problems, decreased concentration and sleep disturbance. The patient is not nervous/anxious and is not hyperactive.    PHYSICAL EXAM; Vitals:   05/22/18 1111  BP: 109/79  Pulse: 88  Weight: 138 lb (62.6 kg)  Height: 5' 9.5" (1.765 m)   Body mass index is 20.09 kg/m.  General Physical Exam: Unchanged from previous exam, date:11/13/2017   Testing/Developmental Screens: CGI/ASRS = 14/13 Reviewed with patient and mother Counseled regarding need to continue to move toward "adulting".  At risk for living at home and not progressing developmentally.       DIAGNOSES:    ICD-10-CM   1. ADHD (attention  deficit hyperactivity disorder), combined type F90.2   2. Dysgraphia R27.8   3. Medication management Z79.899   4. Patient counseled Z71.9   5. Parenting dynamics counseling Z71.89   6. Counseling and coordination of care Z71.89      RECOMMENDATIONS:  Patient Instructions  DISCUSSION: Patient and family counseled regarding the following coordination of care items:  Continue medication as directed Concerta 27 mg every morning Strattera 60 mg every morning Counseled medication administration, effects, and possible side effects.  ADHD medications discussed to include different medications and pharmacologic properties of each. Recommendation for specific medication to include dose, administration, expected effects, possible side effects and the risk to benefit ratio of medication management.  Advised importance of:  Good sleep hygiene (8- 10 hours per night) Limited screen time (none on school nights, no more than 2 hours on weekends) Regular exercise(outside and active play) Healthy eating (drink water, no sodas/sweet tea, limit portions and no seconds).  Counseling at this visit included the review of old records and/or current chart with the patient and family.   Counseling included the following discussion points presented at every visit to improve understanding and treatment compliance.  Recent health history and today's examination Growth and development with anticipatory guidance provided regarding brain growth, executive function maturation and pubertal development School progress and continued advocay for appropriate accommodations to include maintain Structure, routine, organization, reward, motivation and consequences.  Mother verbalized understanding of all topics discussed.  NEXT APPOINTMENT:  Return in about 3 months (around 08/21/2018) for Medical Follow up.  Medical Decision-making: More than 50% of the appointment was spent counseling and discussing diagnosis and management of symptoms with the patient and family.  Counseling Time: 25 minutes Total Contact Time: 30 minutes

## 2018-05-22 NOTE — Patient Instructions (Addendum)
DISCUSSION: Patient and family counseled regarding the following coordination of care items:  Continue medication as directed Concerta 27 mg every morning Strattera 60 mg every morning Counseled medication administration, effects, and possible side effects.  ADHD medications discussed to include different medications and pharmacologic properties of each. Recommendation for specific medication to include dose, administration, expected effects, possible side effects and the risk to benefit ratio of medication management.  Advised importance of:  Good sleep hygiene (8- 10 hours per night) Limited screen time (none on school nights, no more than 2 hours on weekends) Regular exercise(outside and active play) Healthy eating (drink water, no sodas/sweet tea, limit portions and no seconds).  Counseling at this visit included the review of old records and/or current chart with the patient and family.   Counseling included the following discussion points presented at every visit to improve understanding and treatment compliance.  Recent health history and today's examination Growth and development with anticipatory guidance provided regarding brain growth, executive function maturation and pubertal development School progress and continued advocay for appropriate accommodations to include maintain Structure, routine, organization, reward, motivation and consequences.

## 2018-05-26 ENCOUNTER — Institutional Professional Consult (permissible substitution): Payer: 59 | Admitting: Pediatrics

## 2018-06-12 ENCOUNTER — Other Ambulatory Visit: Payer: Self-pay | Admitting: Pediatrics

## 2018-06-13 NOTE — Telephone Encounter (Signed)
Concerta 27 mg daily, # 30 with no refills. RX for above e-scribed and sent to pharmacy on record  [facility] [place], [place] - [place], [place] - [address] [place] [address] Phone: [phone] Fax: [phone]

## 2018-06-13 NOTE — Telephone Encounter (Signed)
Last visit 05/22/2018 next visit 11/11/2018 

## 2018-07-14 ENCOUNTER — Other Ambulatory Visit: Payer: Self-pay | Admitting: Family

## 2018-07-14 NOTE — Telephone Encounter (Signed)
Last visit 05/22/2018 next visit 11/11/2018

## 2018-07-14 NOTE — Telephone Encounter (Signed)
E-Prescribed Concerta 27 mg Q AM directly to  [facility], [place] - [place], [place] - [address] [place] [address] Phone: [phone] Fax: [phone]

## 2018-07-20 IMAGING — DX DG HAND COMPLETE 3+V*R*
3 series · 3 of 3 positions shown · non-contrast
Comparison: None.

CLINICAL DATA: Injury while playing basketball

EXAM:
RIGHT HAND - COMPLETE 3+ VIEW

[hand pa]
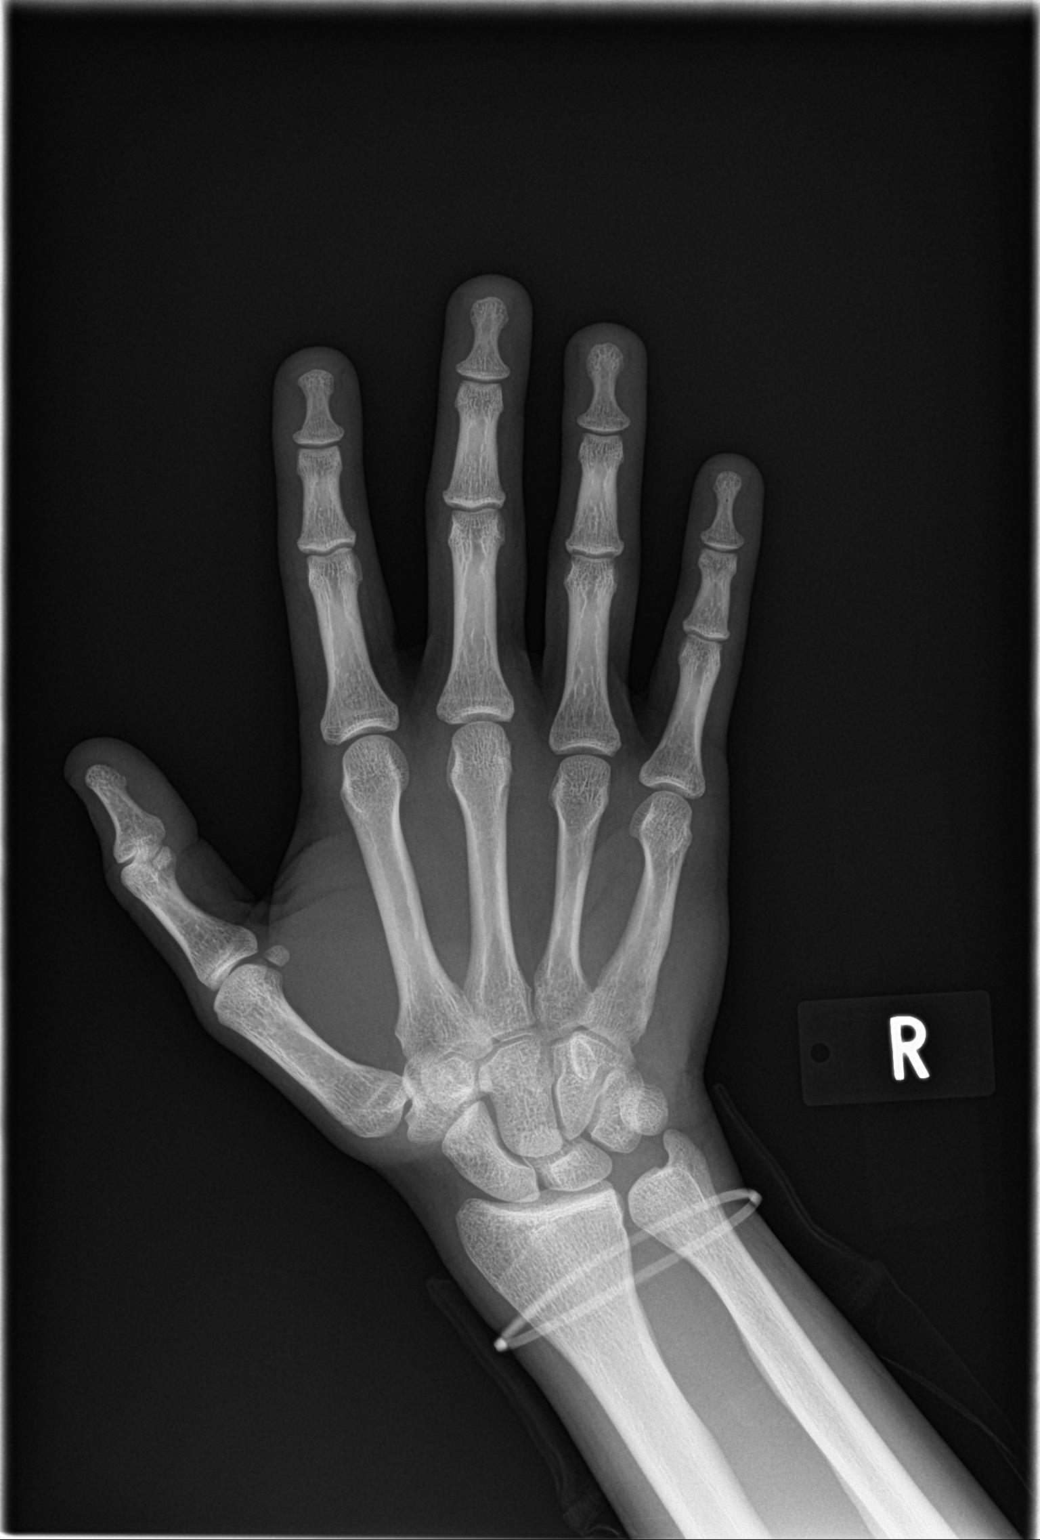

[hand obl]
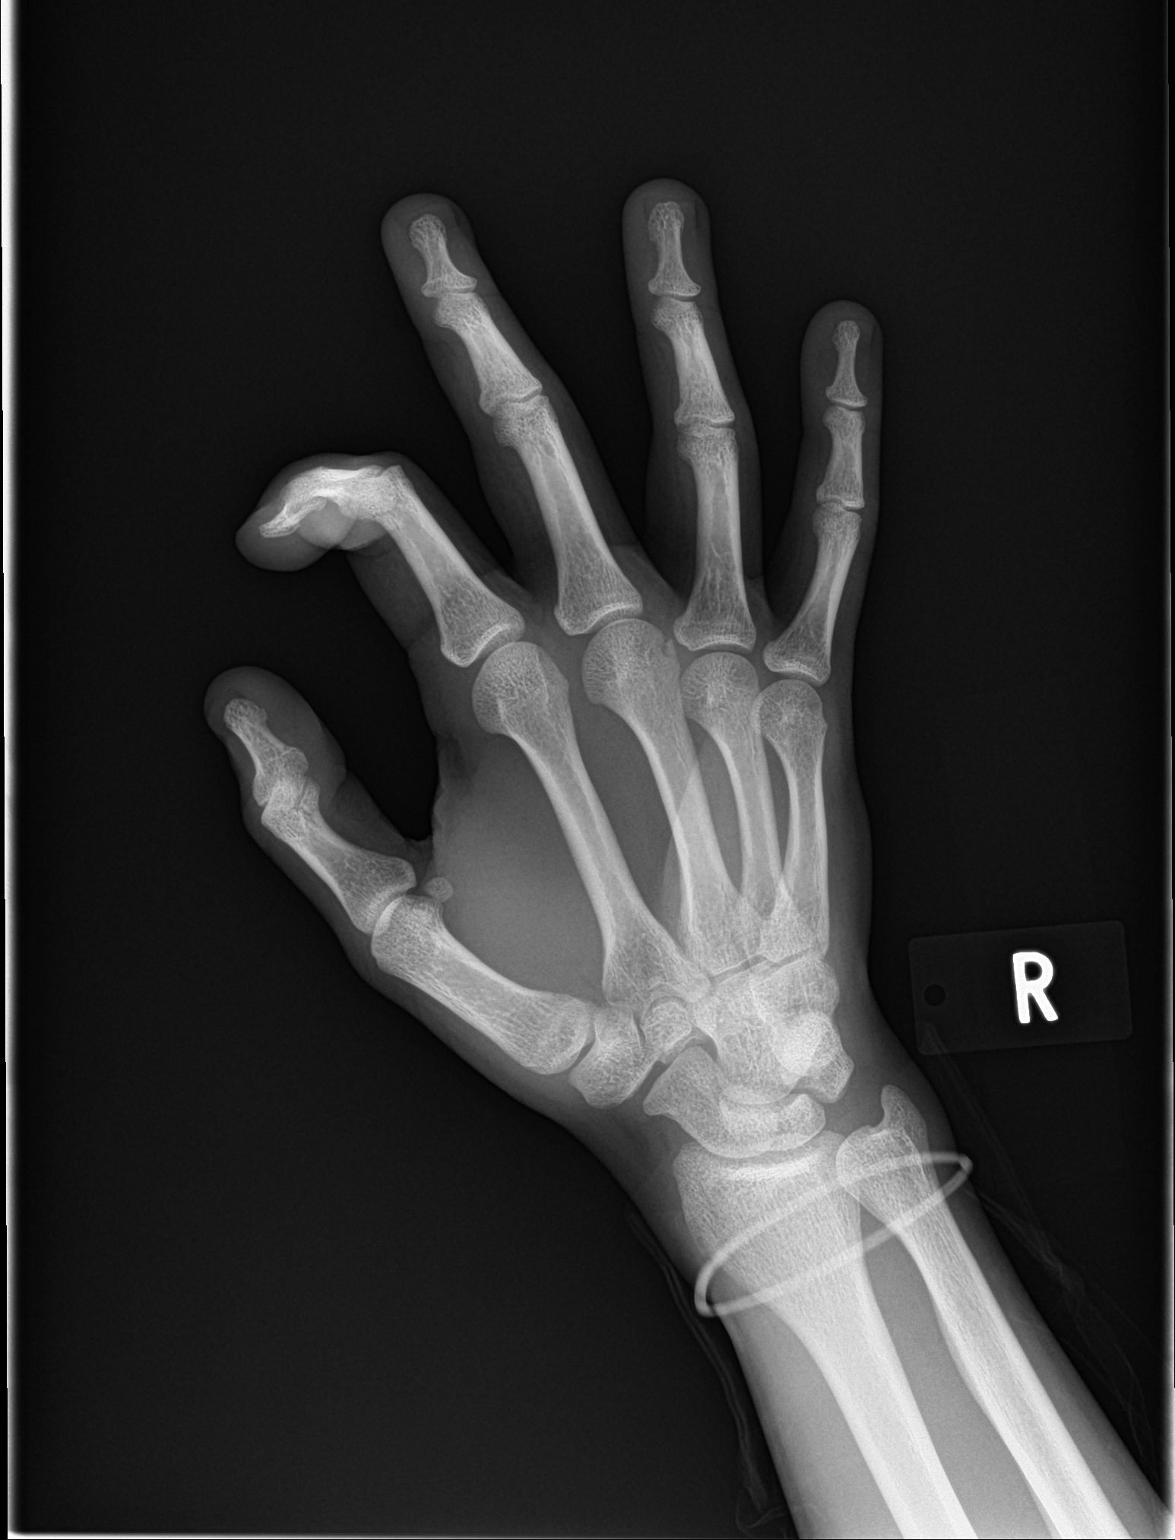

[hand lat]
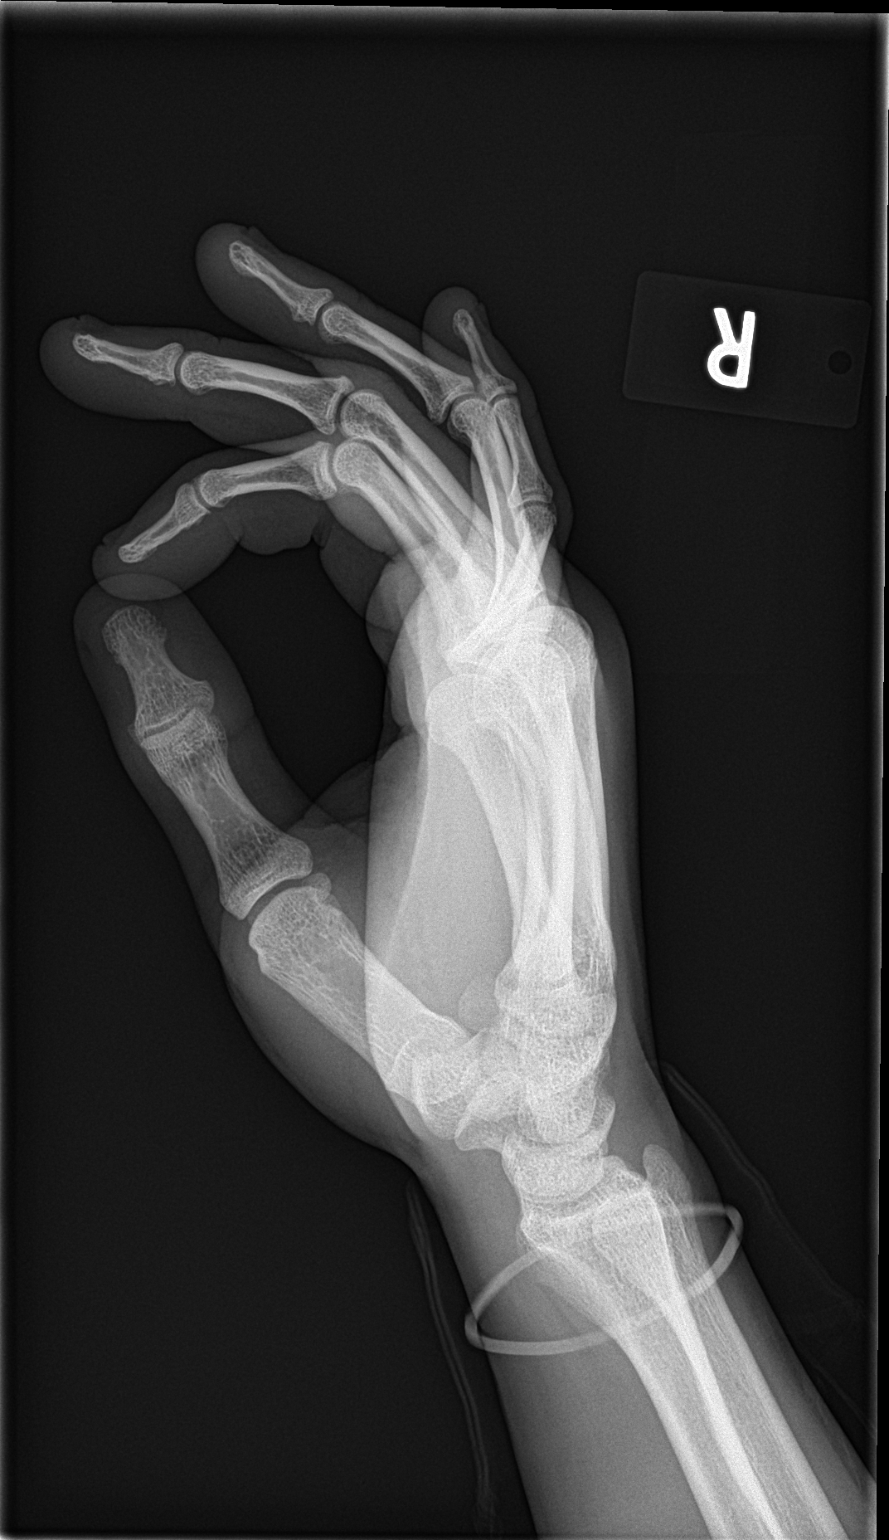

[3 of 3 positions shown; findings below may reference images not displayed]

FINDINGS: Frontal, oblique, and lateral views were obtained. There is an
avulsion along the dorsal, medial aspect of the third distal
metacarpal with alignment near anatomic. No other evident fracture.
No dislocation. Joint spaces appear normal. No erosive change.
IMPRESSION: Avulsion along the dorsal, medial aspect of the third distal
metacarpal with alignment near anatomic. No other fracture. No
dislocation. No evident arthropathy.

These results will be called to the ordering clinician or
representative by the Radiologist Assistant, and communication
documented in the PACS or zVision Dashboard.

## 2018-11-11 ENCOUNTER — Encounter: Payer: Self-pay | Admitting: Pediatrics

## 2018-11-11 ENCOUNTER — Other Ambulatory Visit: Payer: Self-pay

## 2018-11-11 ENCOUNTER — Ambulatory Visit (INDEPENDENT_AMBULATORY_CARE_PROVIDER_SITE_OTHER): Payer: 59 | Admitting: Pediatrics

## 2018-11-11 DIAGNOSIS — R278 Other lack of coordination: Secondary | ICD-10-CM | POA: Diagnosis not present

## 2018-11-11 DIAGNOSIS — Z79899 Other long term (current) drug therapy: Secondary | ICD-10-CM | POA: Diagnosis not present

## 2018-11-11 DIAGNOSIS — Z7189 Other specified counseling: Secondary | ICD-10-CM

## 2018-11-11 DIAGNOSIS — F902 Attention-deficit hyperactivity disorder, combined type: Secondary | ICD-10-CM | POA: Diagnosis not present

## 2018-11-11 DIAGNOSIS — Z719 Counseling, unspecified: Secondary | ICD-10-CM | POA: Diagnosis not present

## 2018-11-11 MED ORDER — ATOMOXETINE HCL 60 MG PO CAPS: 60.0000 mg | ORAL_CAPSULE | Freq: Every day | ORAL | 2 refills | Status: DC

## 2018-11-11 NOTE — Progress Notes (Signed)
[place] [facility] [address] [place] [address] Dept: [phone] Dept Fax: [phone]  Medication Check by FaceTime and telephone due to COVID-19  Patient ID:  Antonio Rice Fait  male DOB: 1995-12-02   23 y.o.   MRN: [id number]   DATE:11/11/18  PCP: Junie SpencerHawks, Christy A, FNP  Interviewed: Antonio Rice Shake and Mother  Name: Juluis RainierNatalie Vanengen Location: Mother's home [facility]  Virtual Visit via Video Note Connected with Antonio Rice Griesinger on 11/11/18 at 11:00 AM EDT by video enabled telemedicine application and verified that I am speaking with the correct person using two identifiers.    I discussed the limitations, risks, security and privacy concerns of performing an evaluation and management service by telephone and the availability of in person appointments. I also discussed with the parents that there may be a patient responsible charge related to this service. The parents expressed understanding and agreed to proceed.  HISTORY OF PRESENT ILLNESS/CURRENT STATUS: Antonio Rice Urizar is being followed for medication management for ADHD, dysgraphia and learning differences.   Last visit on 05/23/2019  Apolinar JunesBrandon currently prescribed Strattera 60 mg and not taking concerta, did not need for not taking classes and he is eating better off stimulant.  Eating well (eating breakfast, lunch and dinner).   Sleeping: about 6 hours and then naps after work feels well rested does not feel fatigued through the day   EDUCATION: School: did not do a spring semester, took a semester off. Will do either [employer] or [employer] Not sure what he wants to do. 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  Activities/ Exercise: daily will be outside playing ball or swimming.  Screen time: (phone, tablet, TV, computer): not more than usual MEDICAL HISTORY: Individual Medical History/ Review of Systems: Changes? :No  Family Medical/ Social History: Changes? No   Patient Lives with: mother Alternating weeks with father  Current Medications:  Strattera 60 mg daily  Medication Side Effects: None  MENTAL HEALTH: Mental Health Issues:    Denies sadness, loneliness or depression. No self harm or thoughts of self harm or injury. Denies fears, worries and anxieties. Has good peer relations and is not a bully nor is victimized.  DIAGNOSES:    ICD-10-CM   1. ADHD (attention deficit hyperactivity disorder), combined type F90.2   2. Dysgraphia R27.8   3. Medication management Z79.899   4. Patient counseled Z71.9   5. Counseling and coordination of care Z71.89      RECOMMENDATIONS:  Patient Instructions  DISCUSSION: Counseled regarding the following coordination of care items:  Continue medication as directed Discontinue Concerta  Continue Strattera 60 mg every morning RX for above e-scribed and sent to pharmacy on record  [facility] [place] - [address] [place] [address] Phone: [phone] Fax: [phone]  Counseled medication administration, effects, and possible side effects.  ADHD medications discussed to include different medications and pharmacologic properties of each. Recommendation for specific medication to include dose, administration, expected effects, possible side effects and the risk to benefit ratio of medication management.  Advised importance of:  Good  sleep hygiene (8- 10 hours per night)  Limited screen time (none on school nights, no more than 2 hours on weekends)  Regular exercise(outside and active play)  Healthy eating (drink water, no  sodas/sweet tea)      Discussed continued need for routine, structure, motivation, reward and positive reinforcement  Encouraged recommended limitations on TV, tablets, phones, video games and computers for non-educational activities.  Encouraged physical activity and outdoor play, maintaining social distancing.  Discussed how to talk to anxious children about coronavirus.   Referred to ADDitudemag.com for resources about engaging children who are at home in home and online study.    NEXT APPOINTMENT:  Return in about 3 months (around 02/11/2019) for Medication Check. Please call the office for a sooner appointment if problems arise.  Medical Decision-making: More than 50% of the appointment was spent counseling and discussing diagnosis and management of symptoms with the patient and family.  I discussed the assessment and treatment plan with the parent. The parent was provided an opportunity to ask questions and all were answered. The parent agreed with the plan and demonstrated an understanding of the instructions.   The parent was advised to call back or seek an in-person evaluation if the symptoms worsen or if the condition fails to improve as anticipated.  I provided 25 minutes of non-face-to-face time during this encounter.   Completed record review for 0 minutes prior to the virtual video visit.   Leticia Penna, NP  Counseling Time: 25 minutes   Total Contact Time: 25 minutes

## 2018-11-11 NOTE — Patient Instructions (Signed)
DISCUSSION: Counseled regarding the following coordination of care items:  Continue medication as directed Discontinue Concerta  Continue Strattera 60 mg every morning RX for above e-scribed and sent to pharmacy on record  [facility] [place], [place] - [address] [place] [address] Phone: [phone] Fax: [phone]  Counseled medication administration, effects, and possible side effects.  ADHD medications discussed to include different medications and pharmacologic properties of each. Recommendation for specific medication to include dose, administration, expected effects, possible side effects and the risk to benefit ratio of medication management.  Advised importance of:  Good sleep hygiene (8- 10 hours per night)  Limited screen time (none on school nights, no more than 2 hours on weekends)  Regular exercise(outside and active play)  Healthy eating (drink water, no sodas/sweet tea)

## 2019-01-10 ENCOUNTER — Other Ambulatory Visit: Payer: Self-pay | Admitting: Pediatrics

## 2019-01-12 NOTE — Telephone Encounter (Signed)
Last visit 11/11/2018 next visit 02/17/2019

## 2019-01-12 NOTE — Telephone Encounter (Signed)
E-Prescribed atomoxetine directly to  [place], [place] - [address] [place] [address] Phone: [phone] Fax: [phone]

## 2019-02-17 ENCOUNTER — Other Ambulatory Visit: Payer: Self-pay

## 2019-02-17 ENCOUNTER — Encounter: Payer: Self-pay | Admitting: Pediatrics

## 2019-02-17 ENCOUNTER — Ambulatory Visit (INDEPENDENT_AMBULATORY_CARE_PROVIDER_SITE_OTHER): Payer: BC Managed Care – PPO | Admitting: Pediatrics

## 2019-02-17 DIAGNOSIS — Z7189 Other specified counseling: Secondary | ICD-10-CM

## 2019-02-17 DIAGNOSIS — R278 Other lack of coordination: Secondary | ICD-10-CM | POA: Diagnosis not present

## 2019-02-17 DIAGNOSIS — Z79899 Other long term (current) drug therapy: Secondary | ICD-10-CM

## 2019-02-17 DIAGNOSIS — F902 Attention-deficit hyperactivity disorder, combined type: Secondary | ICD-10-CM | POA: Diagnosis not present

## 2019-02-17 DIAGNOSIS — Z719 Counseling, unspecified: Secondary | ICD-10-CM

## 2019-02-17 NOTE — Progress Notes (Signed)
[facility]. [address] [place] [address] Dept: [phone] Dept Fax: [phone]  Medication Check by FaceTime due to COVID-19  Patient ID:  Antonio Rice  male DOB: [birth year]   23 y.o.   MRN: [id number]   DATE:02/17/19  PCP: Sharion Balloon, FNP  Interviewed: Antonio Rice and Antonio Rice  Name: Antonio Rice Location: Antonio Rice's home Provider location: Providers private residence, no others present.  Virtual Visit via Video Note Connected with [name] on 02/17/19 at  2:00 PM EDT by video enabled telemedicine application and verified that I am speaking with the correct person using two identifiers.     I discussed the limitations, risks, security and privacy concerns of performing an evaluation and management service by telephone and the availability of in person appointments. I also discussed with the parent/patient that there may be a patient responsible charge related to this service. The parent/patient expressed understanding and agreed to proceed.  HISTORY OF PRESENT ILLNESS/CURRENT STATUS: Antonio Rice is being followed for medication management for ADHD, dysgraphia and learning differences.   Last visit on 11/11/2018 by Pearletha Alfred currently prescribed Strattera 60 mg every morning.  No longer taking stimulant, not taking classes currently.   Eating well (eating breakfast, lunch and dinner).   Sleeping: bedtime 2000 pm on work nights and up at [place] through the night.   EDUCATION: School: was taking classes at [employer], none at present  [employer] work 0530 - 1130 in the morning, paid well.  Got a raise.  No classes. Part time working 5 nights per.  Activities/ Exercise: daily  Work is very physical, lifting boxes. Screen time: (phone, tablet, TV, computer): non-essential  MEDICAL HISTORY: Individual Medical History/ Review of Systems: Changes?  :No  Family Medical/ Social History: Changes? No   Patient Lives with: Antonio Rice and 50/50 father Masks not required at work, wearing when out in public (stores mostly). Current Medications:  Strattera 60 mg every morning  Medication Side Effects: None  MENTAL HEALTH: Mental Health Issues:    Denies sadness, loneliness or depression. No self harm or thoughts of self harm or injury. Denies fears, worries and anxieties. Has good peer relations and is not a bully nor is victimized. Coping well, has new girlfriend and good relationship with parents.  DIAGNOSES:    ICD-10-CM   1. ADHD (attention deficit hyperactivity disorder), combined type  F90.2   2. Dysgraphia  R27.8   3. Medication management  Z79.899   4. Patient counseled  Z71.9   5. Parenting dynamics counseling  Z71.89   6. Counseling and coordination of care  Z71.89      RECOMMENDATIONS:  Patient Instructions  DISCUSSION: Counseled regarding the following coordination of care items:  Continue medication as directed Strattera 60 mg every morning No Rx today, recently submitted 01/11/2019 with refills  Counseled medication administration, effects, and possible side effects.  ADHD medications discussed to include different medications and pharmacologic properties of each. Recommendation for specific medication to include dose, administration, expected effects, possible side effects and the risk to benefit ratio of medication management.  Advised importance of:  Good sleep hygiene (8- 10 hours per night)  Limited screen time (none on school nights, no more than 2 hours on weekends)  Regular exercise(outside and active play)  Healthy eating (drink water, no sodas/sweet tea)  Counseling included the following discussion points presented at every visit to improve understanding and treatment compliance.  Recent health history  and today's examination Growth and development with anticipatory guidance provided regarding brain  growth, executive function maturation and pre or pubertal development. School progress and continued advocay for appropriate accommodations to include maintain Structure, routine, organization, reward, motivation and consequences.  Additionally the patient was counseled to take medication while driving.  Will age out of my practice at next birthday.  Will schedule follow up in December and will discuss transition of care again at that time.         Discussed continued need for routine, structure, motivation, reward and positive reinforcement  Encouraged recommended limitations on TV, tablets, phones, video games and computers for non-educational activities.  Encouraged physical activity and outdoor play, maintaining social distancing.  Discussed how to talk to anxious children about coronavirus.   Referred to ADDitudemag.com for resources about engaging children who are at home in home and online study.    NEXT APPOINTMENT:  Return in about 3 months (around 05/19/2019) for Medication Check. Please call the office for a sooner appointment if problems arise.  Medical Decision-making: More than 50% of the appointment was spent counseling and discussing diagnosis and management of symptoms with the parent/patient.  I discussed the assessment and treatment plan with the parent. The parent/patient was provided an opportunity to ask questions and all were answered. The parent/patient agreed with the plan and demonstrated an understanding of the instructions.   The parent/patient was advised to call back or seek an in-person evaluation if the symptoms worsen or if the condition fails to improve as anticipated.  I provided 25 minutes of non-face-to-face time during this encounter.   Completed record review for 0 minutes prior to the virtual video visit.   [name], NP  Counseling Time: 25 minutes   Total Contact Time: 25 minutes

## 2019-02-17 NOTE — Patient Instructions (Signed)
DISCUSSION: Counseled regarding the following coordination of care items:  Continue medication as directed Strattera 60 mg every morning No Rx today, recently submitted 01/11/2019 with refills  Counseled medication administration, effects, and possible side effects.  ADHD medications discussed to include different medications and pharmacologic properties of each. Recommendation for specific medication to include dose, administration, expected effects, possible side effects and the risk to benefit ratio of medication management.  Advised importance of:  Good sleep hygiene (8- 10 hours per night)  Limited screen time (none on school nights, no more than 2 hours on weekends)  Regular exercise(outside and active play)  Healthy eating (drink water, no sodas/sweet tea)  Counseling included the following discussion points presented at every visit to improve understanding and treatment compliance.  Recent health history and today's examination Growth and development with anticipatory guidance provided regarding brain growth, executive function maturation and pre or pubertal development. School progress and continued advocay for appropriate accommodations to include maintain Structure, routine, organization, reward, motivation and consequences.  Additionally the patient was counseled to take medication while driving.  Will age out of my practice at next birthday.  Will schedule follow up in December and will discuss transition of care again at that time.

## 2019-03-18 ENCOUNTER — Encounter: Payer: Self-pay | Admitting: *Deleted

## 2019-03-18 ENCOUNTER — Other Ambulatory Visit: Payer: Self-pay

## 2019-03-18 ENCOUNTER — Emergency Department
Admission: EM | Admit: 2019-03-18 | Discharge: 2019-03-18 | Disposition: A | Discharge status: Home / Self Care | Payer: BC Managed Care – PPO | Source: Home / Self Care

## 2019-03-18 DIAGNOSIS — L298 Other pruritus: Secondary | ICD-10-CM

## 2019-03-18 MED ORDER — PREDNISONE 50 MG PO TABS: 50.0000 mg | ORAL_TABLET | Freq: Every day | ORAL | 0 refills | Status: AC

## 2019-03-18 MED ORDER — PREDNISONE 50 MG PO TABS: 50.0000 mg | ORAL_TABLET | Freq: Every day | ORAL | 0 refills | Status: DC

## 2019-03-18 MED ORDER — TRIAMCINOLONE ACETONIDE 0.1 % EX CREA: 1.0000 "application " | TOPICAL_CREAM | Freq: Two times a day (BID) | CUTANEOUS | 0 refills | Status: DC

## 2019-03-18 MED ORDER — CETIRIZINE HCL 10 MG PO TABS: 10.0000 mg | ORAL_TABLET | Freq: Every day | ORAL | 0 refills | Status: DC

## 2019-03-18 NOTE — ED Triage Notes (Signed)
Pt c/o itching rash on his arms bilaterally x 5 days.

## 2019-03-18 NOTE — ED Provider Notes (Signed)
[facility]    CSN: [id number] Arrival date & time: 03/18/19  1657      History   Chief Complaint Chief Complaint  Patient presents with  . Rash    HPI Antonio Rice is a 23 y.o. male.   HPI  Antonio Rice is a 23 y.o. male presenting to UC with c/o 5 days of gradually worsening red itchy rash on both forearms, Left upper arm and Left fingers. He has tried OTC anti-itch spray and cortisone cream with mild relief. No new soaps, lotions or medications. No known exposure to poison ivy but he does have a dog that goes outside on occasion.  No other symptoms- no fever, chills, oral swelling.    Past Medical History:  Diagnosis Date  . ADHD (attention deficit hyperactivity disorder)     Patient Active Problem List   Diagnosis Date Noted  . Medication management 11/11/2018  . ADHD (attention deficit hyperactivity disorder), combined type 08/10/2015  . Dysgraphia 08/10/2015    Past Surgical History:  Procedure Laterality Date  . TONSILLECTOMY         Home Medications    Prior to Admission medications   Medication Sig Start Date End Date Taking? Authorizing Provider  atomoxetine (STRATTERA) 60 MG capsule TAKE ONE CAPSULE BY MOUTH EVERY DAY 01/12/19   Dedlow, Milbert Coulter, NP  cetirizine (ZYRTEC) 10 MG tablet Take 1 tablet (10 mg total) by mouth daily. 03/18/19   Noe Gens, PA-C  predniSONE (DELTASONE) 50 MG tablet Take 1 tablet (50 mg total) by mouth daily with breakfast for 5 days. 03/18/19 03/23/19  Noe Gens, PA-C  triamcinolone cream (KENALOG) 0.1 % Apply 1 application topically 2 (two) times daily. 03/18/19   Noe Gens, PA-C    Family History Family History  Problem Relation Age of Onset  . Cancer Brother 17       undifferentiated epitheliod malignancy high grade sarcoma    Social History Social History   Tobacco Use  . Smoking status: Never Smoker  . Smokeless tobacco: Never Used  Substance Use Topics  . Alcohol use: No   Alcohol/week: 0.0 standard drinks  . Drug use: No     Allergies   Patient has no known allergies.   Review of Systems Review of Systems  Musculoskeletal: Negative for arthralgias, joint swelling and myalgias.  Skin: Positive for rash. Negative for wound.     Physical Exam Triage Vital Signs ED Triage Vitals  Enc Vitals Group     BP 03/18/19 1718 111/75     Pulse Rate 03/18/19 1718 78     Resp 03/18/19 1718 18     Temp 03/18/19 1718 99 F (37.2 C)     Temp Source 03/18/19 1718 Oral     SpO2 03/18/19 1718 100 %     Weight 03/18/19 1719 150 lb (68 kg)     Height 03/18/19 1719 5\' 10"  (1.778 m)     Head Circumference --      Peak Flow --      Pain Score 03/18/19 1719 0     Pain Loc --      Pain Edu? --      Excl. in [place]? --    No data found.  Updated Vital Signs BP 111/75 (BP Location: Right Arm)   Pulse 78   Temp 99 F (37.2 C) (Oral)   Resp 18   Ht 5\' 10"  (1.778 m)   Wt 150 lb (68 kg)  SpO2 100%   BMI 21.52 kg/m   Physical Exam Vitals signs and nursing note reviewed.  Constitutional:      Appearance: Normal appearance. He is well-developed.  HENT:     Head: Normocephalic and atraumatic.  Neck:     Musculoskeletal: Normal range of motion.  Cardiovascular:     Rate and Rhythm: Normal rate.  Pulmonary:     Effort: Pulmonary effort is normal.  Musculoskeletal: Normal range of motion.        General: No swelling or tenderness.  Skin:    General: Skin is warm and dry.     Findings: Rash present.          Comments: Diffuse erythematous vesicular type rash on both arms, Left upper arm and hand. Non-tender. No bleeding or discharge.   Neurological:     Mental Status: He is alert and oriented to person, place, and time.  Psychiatric:        Behavior: Behavior normal.      UC Treatments / Results  Labs (all labs ordered are listed, but only abnormal results are displayed) Labs Reviewed - No data to display  EKG   Radiology No results found.   Procedures Procedures (including critical care time)  Medications Ordered in UC Medications - No data to display  Initial Impression / Assessment and Plan / UC Course  I have reviewed the triage vital signs and the nursing notes.  Pertinent labs & imaging results that were available during my care of the patient were reviewed by me and considered in my medical decision making (see chart for details).    Will tx for suspect allergic reaction AVS provided.  Final Clinical Impressions(s) / UC Diagnoses   Final diagnoses:  Pruritic erythematous rash   Discharge Instructions   None    ED Prescriptions    Medication Sig Dispense Auth. Provider   triamcinolone cream (KENALOG) 0.1 %  (Status: Discontinued) Apply 1 application topically 2 (two) times daily. 30 g Kathie Posa O, PA-C   predniSONE (DELTASONE) 50 MG tablet  (Status: Discontinued) Take 1 tablet (50 mg total) by mouth daily with breakfast for 5 days. 5 tablet Doroteo Glassman, Staley Budzinski O, PA-C   cetirizine (ZYRTEC) 10 MG tablet  (Status: Discontinued) Take 1 tablet (10 mg total) by mouth daily. 30 tablet Waylan Rocher O, PA-C   cetirizine (ZYRTEC) 10 MG tablet Take 1 tablet (10 mg total) by mouth daily. 30 tablet Doroteo Glassman, Ibraheem Voris O, PA-C   predniSONE (DELTASONE) 50 MG tablet Take 1 tablet (50 mg total) by mouth daily with breakfast for 5 days. 5 tablet Waylan Rocher O, PA-C   triamcinolone cream (KENALOG) 0.1 % Apply 1 application topically 2 (two) times daily. 30 g Lurene Shadow, [place]     PDMP not reviewed this encounter.   Lurene Shadow, [place] 03/18/19 [phone]

## 2019-04-09 ENCOUNTER — Other Ambulatory Visit: Payer: Self-pay | Admitting: Pediatrics

## 2019-04-10 ENCOUNTER — Other Ambulatory Visit: Payer: Self-pay

## 2019-04-10 DIAGNOSIS — Z20822 Contact with and (suspected) exposure to covid-19: Secondary | ICD-10-CM

## 2019-04-10 NOTE — Telephone Encounter (Signed)
Strattera 60 mg daily, # 30 with 2 RF's.RX for above e-scribed and sent to pharmacy on record  [facility] - [place], [place] - [address] [place] [address] Phone: [phone] Fax: [phone]     

## 2019-04-11 LAB — NOVEL CORONAVIRUS, NAA: SARS-CoV-2, NAA: DETECTED — AB

## 2019-06-01 ENCOUNTER — Encounter: Payer: Self-pay | Admitting: Pediatrics

## 2019-06-01 ENCOUNTER — Ambulatory Visit (INDEPENDENT_AMBULATORY_CARE_PROVIDER_SITE_OTHER): Payer: BC Managed Care – PPO | Admitting: Pediatrics

## 2019-06-01 DIAGNOSIS — R278 Other lack of coordination: Secondary | ICD-10-CM | POA: Diagnosis not present

## 2019-06-01 DIAGNOSIS — F902 Attention-deficit hyperactivity disorder, combined type: Secondary | ICD-10-CM

## 2019-06-01 DIAGNOSIS — Z79899 Other long term (current) drug therapy: Secondary | ICD-10-CM | POA: Diagnosis not present

## 2019-06-01 DIAGNOSIS — Z7189 Other specified counseling: Secondary | ICD-10-CM

## 2019-06-01 DIAGNOSIS — Z719 Counseling, unspecified: Secondary | ICD-10-CM | POA: Diagnosis not present

## 2019-06-01 MED ORDER — ATOMOXETINE HCL 60 MG PO CAPS: 60.0000 mg | ORAL_CAPSULE | Freq: Every day | ORAL | 2 refills | Status: DC

## 2019-06-01 NOTE — Patient Instructions (Addendum)
DISCUSSION: Counseled regarding the following coordination of care items:  Continue medication as directed Strattera 60 mg every morning RX for above e-scribed and sent to pharmacy on record  [place], [place] - [address] [place] [address] Phone: [phone] Fax: [phone]   Discussed transition of care at age 23 years to PCP or FNP at [facility] beginning with June follow up.  Counseled medication administration, effects, and possible side effects.  ADHD medications discussed to include different medications and pharmacologic properties of each. Recommendation for specific medication to include dose, administration, expected effects, possible side effects and the risk to benefit ratio of medication management.  Advised importance of:  Good sleep hygiene (8- 10 hours per night)  Limited screen time (none on school nights, no more than 2 hours on weekends)  Regular exercise(outside and active play)  Healthy eating (drink water, no sodas/sweet tea)  Counseling at this visit included the review of old records and/or current chart.   Counseling included the following discussion points presented at every visit to improve understanding and treatment compliance.  Recent health history and today's examination Growth and development with anticipatory guidance provided regarding brain growth, executive function maturation and pre or pubertal development. School progress and continued advocay for appropriate accommodations to include maintain Structure, routine, organization, reward, motivation and consequences.  Additionally the patient was counseled to take medication while driving.

## 2019-06-01 NOTE — Progress Notes (Signed)
[facility]. [address] [place] [address] Dept: [phone] Dept Fax: [phone]  Medication Check by FaceTime due to COVID-19  Patient ID:  Antonio Rice  male DOB: 1996-04-11   23 y.o.   MRN: [id number]   DATE:06/01/19  PCP: Sharion Balloon, FNP  Interviewed: Antonio Rice  Location: [facility] Provider location: [facility] office  Virtual Visit via Video Note Connected with Antonio Rice on 06/01/19 at  2:00 PM EST by video enabled telemedicine application and verified that I am speaking with the correct person using two identifiers.    I discussed the limitations, risks, security and privacy concerns of performing an evaluation and management service by telephone and the availability of in person appointments. I also discussed with the parent/patient that there may be a patient responsible charge related to this service. The parent/patient expressed understanding and agreed to proceed.  HISTORY OF PRESENT ILLNESS/CURRENT STATUS: Antonio Rice is being followed for medication management for ADHD, dysgraphia and learning differences.   Last visit on 02/17/2019  Antonio Rice currently prescribed Strattera 60 mg daily    Behaviors:  No changes to [name] well (eating breakfast, lunch and dinner).   Sleeping: bedtime 2200 pm awake by 0430 work days Sleeping through the night.   EDUCATION: No [employer] classes  [employer] working - five days per week 0530 - 1130 - is okay. Thinking about going to school, not sure what he wants to do.  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  Screen time: (phone, tablet, TV, computer): non-essential, not excessive  MEDICAL HISTORY: Individual Medical History/ Review of Systems: Changes? :No Cough, runny nose and joint pain, and fever. Got exposed  through work.  Gave to father, two friends, step Mom and step sister. Mom did not get ill.    Family Medical/ Social History: Changes? No   Patient Lives with: mother and switches with father ans tep mother every week. Was father's week when he got sick so did full quarantine there.  Current Medications:  Strattera 60 mg every day  Medication Side Effects: None  MENTAL HEALTH: Mental Health Issues:    Denies sadness, loneliness or depression. No self harm or thoughts of self harm or injury. Denies fears, worries and anxieties. Has good peer relations and is not a bully nor is victimized. Coping doing well.  DIAGNOSES:    ICD-10-CM   1. ADHD (attention deficit hyperactivity disorder), combined type  F90.2   2. Dysgraphia  R27.8   3. Medication management  Z79.899   4. Patient counseled  Z71.9   5. Counseling and coordination of care  Z71.89      RECOMMENDATIONS:  Patient Instructions  DISCUSSION: Counseled regarding the following coordination of care items:  Continue medication as directed Strattera 60 mg every morning RX for above e-scribed and sent to pharmacy on record  [place], [place] - [address] [place] [address] Phone: [phone] Fax: [phone]   Discussed transition of care at age 77 years to PCP or FNP at [facility] beginning with June follow up.  Counseled medication administration, effects, and possible side effects.  ADHD medications discussed to include different medications and pharmacologic properties of each. Recommendation for specific medication to include dose, administration, expected effects, possible side effects and the risk to benefit  ratio of medication management.  Advised importance of:  Good sleep hygiene (8- 10 hours per night)  Limited screen time (none on school nights, no more than 2 hours on weekends)  Regular exercise(outside and active play)  Healthy eating (drink water, no  sodas/sweet tea)  Counseling at this visit included the review of old records and/or current chart.   Counseling included the following discussion points presented at every visit to improve understanding and treatment compliance.  Recent health history and today's examination Growth and development with anticipatory guidance provided regarding brain growth, executive function maturation and pre or pubertal development. School progress and continued advocay for appropriate accommodations to include maintain Structure, routine, organization, reward, motivation and consequences.  Additionally the patient was counseled to take medication while driving.          Discussed continued need for routine, structure, motivation, reward and positive reinforcement  Encouraged recommended limitations on TV, tablets, phones, video games and computers for non-educational activities.  Encouraged physical activity and outdoor play, maintaining social distancing.  Discussed how to talk to anxious children about coronavirus.   Referred to ADDitudemag.com for resources about engaging children who are at home in home and online study.    NEXT APPOINTMENT:  Return in about 3 months (around 08/30/2019) for Medication Check. Please call the office for a sooner appointment if problems arise.  Medical Decision-making: More than 50% of the appointment was spent counseling and discussing diagnosis and management of symptoms with the parent/patient.  I discussed the assessment and treatment plan with the parent. The parent/patient was provided an opportunity to ask questions and all were answered. The parent/patient agreed with the plan and demonstrated an understanding of the instructions.   The parent/patient was advised to call back or seek an in-person evaluation if the symptoms worsen or if the condition fails to improve as anticipated.  I provided 25 minutes of non-face-to-face time during this  encounter.   Completed record review for 0 minutes prior to the virtual video visit.   Leticia Penna, NP  Counseling Time: 25 minutes   Total Contact Time: 25 minutes

## 2019-10-03 ENCOUNTER — Other Ambulatory Visit: Payer: Self-pay | Admitting: Pediatrics

## 2019-10-05 NOTE — Telephone Encounter (Signed)
Strattera 60 mg daily, # 30 with 2 RF's.RX for above e-scribed and sent to pharmacy on record  [facility] - [place], [place] - [address] [place] [address] Phone: [phone] Fax: [phone]     

## 2019-10-14 ENCOUNTER — Telehealth: Payer: Self-pay | Admitting: Family

## 2019-10-14 NOTE — Telephone Encounter (Signed)
APPOINTMENT SCHEDULED

## 2019-10-15 ENCOUNTER — Telehealth (INDEPENDENT_AMBULATORY_CARE_PROVIDER_SITE_OTHER): Payer: BC Managed Care – PPO | Admitting: Family Medicine

## 2019-10-15 ENCOUNTER — Encounter: Payer: Self-pay | Admitting: Family Medicine

## 2019-10-15 DIAGNOSIS — B353 Tinea pedis: Secondary | ICD-10-CM | POA: Diagnosis not present

## 2019-10-15 MED ORDER — ATHLETES FOOT 2 % EX POWD: CUTANEOUS | 3 refills | Status: AC | PRN

## 2019-10-15 NOTE — Progress Notes (Signed)
   Virtual Visit via Mychart Video Note  I connected with Antonio Rice on 10/15/19 at 1706 by video and verified that I am speaking with the correct person using two identifiers. Antonio Rice is currently located at home and no other people are currently with her during visit. The provider, Elige Radon Axiel Fjeld, MD is located in their office at time of visit.  Call ended at 1716  I discussed the limitations, risks, security and privacy concerns of performing an evaluation and management service by video and the availability of in person appointments. I also discussed with the patient that there may be a patient responsible charge related to this service. The patient expressed understanding and agreed to proceed.   History and Present Illness: Patient has fought athletes foot and started bleeding 2 days ago and epson salt help stopped the blood and he is still fighting athletes foot. The bleeding was between the pinky toe on the left foot. He gets very   1. Tinea pedis of both feet     Outpatient Encounter Medications as of 10/15/2019  Medication Sig  . atomoxetine (STRATTERA) 60 MG capsule TAKE ONE CAPSULE BY MOUTH EVERY DAY  . cetirizine (ZYRTEC) 10 MG tablet Take 1 tablet (10 mg total) by mouth daily.  . miconazole (ATHLETES FOOT) 2 % powder Apply topically as needed for itching.  . triamcinolone cream (KENALOG) 0.1 % Apply 1 application topically 2 (two) times daily.   No facility-administered encounter medications on file as of 10/15/2019.    Review of Systems  Constitutional: Negative for chills and fever.  Respiratory: Negative for shortness of breath and wheezing.   Cardiovascular: Negative for chest pain and leg swelling.  Musculoskeletal: Negative for back pain and gait problem.  Skin: Positive for rash.  All other systems reviewed and are negative.   Observations/Objective: Patient sounds comfortable and in no acute distress  Assessment and Plan: Problem List Items  Addressed This Visit    None    Visit Diagnoses    Tinea pedis of both feet    -  Primary   Relevant Medications   miconazole (ATHLETES FOOT) 2 % powder       Follow up plan: Return if symptoms worsen or fail to improve.     I discussed the assessment and treatment plan with the patient. The patient was provided an opportunity to ask questions and all were answered. The patient agreed with the plan and demonstrated an understanding of the instructions.   The patient was advised to call back or seek an in-person evaluation if the symptoms worsen or if the condition fails to improve as anticipated.  The above assessment and management plan was discussed with the patient. The patient verbalized understanding of and has agreed to the management plan. Patient is aware to call the clinic if symptoms persist or worsen. Patient is aware when to return to the clinic for a follow-up visit. Patient educated on when it is appropriate to go to the emergency department.    I provided 10 minutes of non-face-to-face time during this encounter.    Nils Pyle, MD

## 2020-01-06 ENCOUNTER — Other Ambulatory Visit: Payer: Self-pay | Admitting: Family

## 2020-02-03 ENCOUNTER — Other Ambulatory Visit: Payer: Self-pay | Admitting: Family

## 2020-02-03 NOTE — Telephone Encounter (Signed)
Last visit 05/18/2019  Next visit Visit date not found Front desk to call to schedule E-Prescribed atomoxetine 60 directly to  [facility] [place], [place] - [address] [place] [address] Phone: [phone] Fax: [phone]

## 2020-02-18 ENCOUNTER — Other Ambulatory Visit: Payer: Self-pay

## 2020-02-18 ENCOUNTER — Ambulatory Visit (INDEPENDENT_AMBULATORY_CARE_PROVIDER_SITE_OTHER): Payer: BC Managed Care – PPO | Admitting: Family

## 2020-02-18 ENCOUNTER — Encounter: Payer: Self-pay | Admitting: Family

## 2020-02-18 VITALS — BP 102/64 | HR 76 | Resp 16 | Ht 69.0 in | Wt 145.4 lb

## 2020-02-18 DIAGNOSIS — F902 Attention-deficit hyperactivity disorder, combined type: Secondary | ICD-10-CM | POA: Diagnosis not present

## 2020-02-18 DIAGNOSIS — Z719 Counseling, unspecified: Secondary | ICD-10-CM

## 2020-02-18 DIAGNOSIS — Z7189 Other specified counseling: Secondary | ICD-10-CM

## 2020-02-18 DIAGNOSIS — Z79899 Other long term (current) drug therapy: Secondary | ICD-10-CM | POA: Diagnosis not present

## 2020-02-18 DIAGNOSIS — R278 Other lack of coordination: Secondary | ICD-10-CM

## 2020-02-18 MED ORDER — ATOMOXETINE HCL 60 MG PO CAPS: 60.0000 mg | ORAL_CAPSULE | Freq: Every day | ORAL | 2 refills | Status: DC

## 2020-02-18 NOTE — Progress Notes (Signed)
[facility] [address] [place] [address] Dept: [phone] Dept Fax: [phone] Loc: [phone] Loc Fax: [phone]  Medication Check  Patient ID: Antonio Rice, male  DOB: 06/28/1995, 24 y.o.  MRN: [id number]  Date of Evaluation: 02/18/20 PCP: Junie Spencer, FNP  Accompanied by: self Patient Lives with: parents  HISTORY/CURRENT STATUS: HPI Patient here by himself for the visit. Patient interactive and appropriate with provider. Patient not enrolled in school at this time and is now working full-time. Patient getting plenty of sleep and no recent health issues. Patient has continued to take his Strattera 60 mg with no side effects or adverse effects reported.   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   MEDICAL HISTORY: Appetite: Good  MVI/Other: None   Getting a variety of foods in his diet  Sleep: Bedtime: 10:00 pm  Awakens: 4:00 am  Concerns: Initiation/Maintenance/Other: none reported.   Individual Medical History/ Review of Systems: Changes? :Yes, Wisdom Teeth extracted last Friday. Completed Rx for Abx and stopped Norco after 3 days.   Allergies: Patient has no known allergies.  Current Medications:  Current Outpatient Medications:  .  atomoxetine (STRATTERA) 60 MG capsule, Take 1 capsule (60 mg total) by mouth daily., Disp: 30 capsule, Rfl: 2 .  cetirizine (ZYRTEC) 10 MG tablet, Take 1 tablet (10 mg total) by mouth daily., Disp: 30 tablet, Rfl: 0 .  miconazole (ATHLETES FOOT) 2 % powder, Apply topically as needed for itching., Disp: 70 g, Rfl: 3 Medication Side Effects: None  Family Medical/ Social History: Changes? None reported by patient.   MENTAL HEALTH: Mental  Health Issues: none reported  PHYSICAL EXAM; Vitals:  Vitals:   02/18/20 1509  BP: 102/64  Pulse: 76  Resp: 16  Height: 5\' 9"  (1.753 m)  Weight: 145 lb 6.4 oz (66 kg)  BMI (Calculated): 21.46   General Physical Exam: Unchanged from previous exam, date:05/31/2020 Changed: None  DIAGNOSES:    ICD-10-CM   1. ADHD (attention deficit hyperactivity disorder), combined type  F90.2   2. Dysgraphia  R27.8   3. Medication management  Z79.899   4. Goals of care, counseling/discussion  Z71.89   5. Patient counseled  Z71.9    RECOMMENDATIONS: Counseling at this visit included the review of old records and/or current chart with the patient with updates for school, work, health and medications.   Discussed recent history and today's examination with patient with no changes on exam today.   Counseled regarding recent changes with schooling, not enrolled and now working full-time.  Recommended a high protein, low sugar diet for ADHD, watch portion sizes, avoid second helpings, avoid sugary snacks and drinks, drink more water, eat more fruits and vegetables, increase daily exercise.  Discussed work progress and advocated for appropriate accommodations if needed.   Discussed importance of maintaining structure, routine, organization, reward, motivation and consequences with consistency at home and work settings.   Counseled medication pharmacokinetics, options, dosage, administration, desired effects, and possible side effects.    Advised importance of:  Good sleep hygiene (8- 10 hours per night, no TV or video games for 1 hour before bedtime) Limited screen time (none on school nights, no more than 2 hours/day on weekends, use of screen time for motivation) Regular exercise(outside and active play) Healthy eating (drink water  or milk, no sodas/sweet tea, limit portions and no seconds).   NEXT APPOINTMENT: Return in about 3 months (around 05/19/2020) for f/u visit.  Medical  Decision-making: More than 50% of the appointment was spent counseling and discussing diagnosis and management of symptoms with the patient and family.  Carron Curie, NP Counseling Time: 30 mins  Total Contact Time: 35 mins

## 2020-04-15 ENCOUNTER — Other Ambulatory Visit: Payer: Self-pay

## 2020-04-15 ENCOUNTER — Emergency Department
Admission: EM | Admit: 2020-04-15 | Discharge: 2020-04-15 | Disposition: A | Discharge status: Home / Self Care | Payer: BC Managed Care – PPO | Source: Home / Self Care

## 2020-04-15 DIAGNOSIS — R369 Urethral discharge, unspecified: Secondary | ICD-10-CM | POA: Diagnosis not present

## 2020-04-15 MED ORDER — DOXYCYCLINE HYCLATE 100 MG PO CAPS
100.0000 mg | ORAL_CAPSULE | Freq: Two times a day (BID) | ORAL | 0 refills | Status: AC
Start: 2020-04-15 — End: 2020-04-22

## 2020-04-15 MED ORDER — CEFTRIAXONE SODIUM 500 MG IJ SOLR
500.0000 mg | Freq: Once | INTRAMUSCULAR | Status: AC
  Administered 2020-04-15: 500 mg via INTRAMUSCULAR

## 2020-04-15 NOTE — ED Triage Notes (Signed)
Pt states that since Wednesday he has been having a penile discharge and testicular pain. Testicular pain has been occurring intermittently. Pt is not aware of any exposure. Pt is aox4 and ambulatory.

## 2020-04-15 NOTE — ED Provider Notes (Signed)
[facility]    CSN: [id number] Arrival date & time: 04/15/20  1709      History   Chief Complaint Chief Complaint  Patient presents with  . SEXUALLY TRANSMITTED DISEASE    symptomatic since wednesday    HPI Antonio Rice is a 24 y.o. male.   HPI  Antonio Rice is a 24 y.o. male presenting to UC with c/o penile discharge and intermittent testicular pain that started 2 days ago. Denies pain at this time. Denies fever, chills, n/v/d. No known exposure to an STI. Denies abdominal pain or back pain.   Past Medical History:  Diagnosis Date  . ADHD (attention deficit hyperactivity disorder)     Patient Active Problem List   Diagnosis Date Noted  . Medication management 11/11/2018  . ADHD (attention deficit hyperactivity disorder), combined type 08/10/2015  . Dysgraphia 08/10/2015    Past Surgical History:  Procedure Laterality Date  . TONSILLECTOMY         Home Medications    Prior to Admission medications   Medication Sig Start Date End Date Taking? Authorizing Provider  atomoxetine (STRATTERA) 60 MG capsule Take 1 capsule (60 mg total) by mouth daily. 02/18/20  Yes Paretta-Leahey, Miachel Roux, NP  cetirizine (ZYRTEC) 10 MG tablet Take 1 tablet (10 mg total) by mouth daily. 03/18/19   Lurene Shadow, PA-C  doxycycline (VIBRAMYCIN) 100 MG capsule Take 1 capsule (100 mg total) by mouth 2 (two) times daily for 7 days. 04/15/20 04/22/20  Lurene Shadow, PA-C  miconazole (ATHLETES FOOT) 2 % powder Apply topically as needed for itching. 10/15/19   Dettinger, Elige Radon, MD    Family History Family History  Problem Relation Age of Onset  . Cancer Brother 17       undifferentiated epitheliod malignancy high grade sarcoma    Social History Social History   Tobacco Use  . Smoking status: Never Smoker  . Smokeless tobacco: Never Used  Vaping Use  . Vaping Use: Never used  Substance Use Topics  . Alcohol use: No    Alcohol/week: 0.0 standard drinks  . Drug  use: No     Allergies   Patient has no known allergies.   Review of Systems Review of Systems  Constitutional: Negative for chills and fever.  Gastrointestinal: Negative for abdominal pain, diarrhea, nausea and vomiting.  Genitourinary: Positive for discharge and dysuria. Negative for scrotal swelling, testicular pain and urgency.  Neurological: Negative for dizziness, light-headedness and headaches.     Physical Exam Triage Vital Signs ED Triage Vitals  Enc Vitals Group     BP 04/15/20 1745 119/79     Pulse Rate 04/15/20 1745 98     Resp 04/15/20 1745 17     Temp 04/15/20 1745 98.3 F (36.8 C)     Temp Source 04/15/20 1745 Oral     SpO2 04/15/20 1745 100 %     Weight --      Height --      Head Circumference --      Peak Flow --      Pain Score 04/15/20 1746 0     Pain Loc --      Pain Edu? --      Excl. in GC? --    No data found.  Updated Vital Signs BP 119/79 (BP Location: Left Arm)   Pulse 98   Temp 98.3 F (36.8 C) (Oral)   Resp 17   SpO2 100%   Visual Acuity Right Eye  Distance:   Left Eye Distance:   Bilateral Distance:    Right Eye Near:   Left Eye Near:    Bilateral Near:     Physical Exam Vitals and nursing note reviewed.  Constitutional:      Appearance: Normal appearance. He is well-developed.  HENT:     Head: Normocephalic and atraumatic.  Cardiovascular:     Rate and Rhythm: Normal rate and regular rhythm.  Pulmonary:     Effort: Pulmonary effort is normal. No respiratory distress.     Breath sounds: Normal breath sounds.  Abdominal:     General: There is no distension.     Palpations: Abdomen is soft.     Tenderness: There is no abdominal tenderness. There is no right CVA tenderness or left CVA tenderness.  Musculoskeletal:        General: Normal range of motion.     Cervical back: Normal range of motion.  Skin:    General: Skin is warm and dry.  Neurological:     Mental Status: He is alert and oriented to person, place, and  time.  Psychiatric:        Behavior: Behavior normal.      UC Treatments / Results  Labs (all labs ordered are listed, but only abnormal results are displayed) Labs Reviewed  C. TRACHOMATIS/N. GONORRHOEAE RNA    EKG   Radiology No results found.  Procedures Procedures (including critical care time)  Medications Ordered in UC Medications  cefTRIAXone (ROCEPHIN) injection 500 mg (has no administration in time range)    Initial Impression / Assessment and Plan / UC Course  I have reviewed the triage vital signs and the nursing notes.  Pertinent labs & imaging results that were available during my care of the patient were reviewed by me and considered in my medical decision making (see chart for details).     Urine sent to lab for GC/chlamydia testing Will tx empirically  F/u with PCP or urology next week if not improving. Final Clinical Impressions(s) / UC Diagnoses   Final diagnoses:  Penile discharge     Discharge Instructions      Refrain from sexual intercourse for 7 days. Be sure to have all partners tested and treated for STDs.  Practice safe sex by always wearing condoms.  Follow up with primary care or a urologist next week if not improving.    ED Prescriptions    Medication Sig Dispense Auth. Provider   doxycycline (VIBRAMYCIN) 100 MG capsule Take 1 capsule (100 mg total) by mouth 2 (two) times daily for 7 days. 14 capsule Lurene Shadow, [place]     PDMP not reviewed this encounter.   Lurene Shadow, [place] 04/15/20 1813

## 2020-04-15 NOTE — Discharge Instructions (Signed)
°  Refrain from sexual intercourse for 7 days. Be sure to have all partners tested and treated for STDs.  Practice safe sex by always wearing condoms.  Follow up with primary care or a urologist next week if not improving.

## 2020-04-18 LAB — C. TRACHOMATIS/N. GONORRHOEAE RNA
C. trachomatis RNA, TMA: NOT DETECTED
N. gonorrhoeae RNA, TMA: NOT DETECTED

## 2020-05-09 ENCOUNTER — Other Ambulatory Visit: Payer: Self-pay | Admitting: Family

## 2020-05-09 NOTE — Telephone Encounter (Signed)
Strattera 60 mg daily, # 30 with 2 RF's.RX for above e-scribed and sent to pharmacy on record  [facility] - [place], [place] - [address] [place] [address] Phone: [phone] Fax: [phone]     

## 2020-07-06 ENCOUNTER — Encounter: Payer: Self-pay | Admitting: Family Medicine

## 2020-07-06 ENCOUNTER — Ambulatory Visit: Payer: BC Managed Care – PPO | Admitting: Family Medicine

## 2020-07-06 ENCOUNTER — Other Ambulatory Visit: Payer: Self-pay

## 2020-07-06 VITALS — BP 128/76 | HR 90 | Ht 70.0 in | Wt 165.0 lb

## 2020-07-06 DIAGNOSIS — L729 Follicular cyst of the skin and subcutaneous tissue, unspecified: Secondary | ICD-10-CM | POA: Diagnosis not present

## 2020-07-06 DIAGNOSIS — L739 Follicular disorder, unspecified: Secondary | ICD-10-CM

## 2020-07-06 MED ORDER — SULFAMETHOXAZOLE-TRIMETHOPRIM 800-160 MG PO TABS
1.0000 | ORAL_TABLET | Freq: Two times a day (BID) | ORAL | 0 refills | Status: DC
Start: 2020-07-06 — End: 2020-07-12

## 2020-07-06 NOTE — Progress Notes (Signed)
BP 128/76   Pulse 90   Ht 5\' 10"  (1.778 m)   Wt 165 lb (74.8 kg)   SpO2 100%   BMI 23.68 kg/m    Subjective:   Patient ID: Antonio Rice, male    DOB: 10/04/1995, 25 y.o.   MRN: [id number]  HPI: Antonio Rice is a 25 y.o. male presenting on 07/06/2020 for No chief complaint on file.   HPI Pt coming in today for a 9 month history of a painful cyst on his chest. He states this started as a bump over his mid sternum 9 months ago that he was able to pop himself. He describes yellow purulent drainage followed by some blood the first couple times he popped it, after which the swelling went away. He has done this several times over the past few months but when it recently came back in December, he was unable to drain it and it has not gone away. He states that warm compresses help reduce the pain. He does shave his chest occasionally. He denies fevers, chills, weight loss, rash, bug bites, myalgias, joint pains or injury to the site prior to onset of symptoms. He decided to be seen in hopes that he could prevent this from happening again.  Relevant past medical, surgical, family and social history reviewed and updated as indicated. Interim medical history since our last visit reviewed. Allergies and medications reviewed and updated.  Review of Systems  Constitutional: Negative.   HENT: Negative.   Eyes: Negative.   Respiratory: Negative.   Cardiovascular: Negative.   Endocrine: Negative.   Genitourinary: Negative.   Musculoskeletal: Negative.   Skin: Positive for color change (says the cyst is red). Negative for rash and wound.  Neurological: Negative.   Hematological: Negative.   Psychiatric/Behavioral: Negative.     Per HPI unless specifically indicated above   Allergies as of 07/06/2020   No Known Allergies     Medication List       Accurate as of July 06, 2020  1:23 PM. If you have any questions, ask your nurse or doctor.        STOP taking these medications    cetirizine 10 MG tablet Commonly known as: ZYRTEC Stopped by: July 08, 2020 Stalin Gruenberg, MD     TAKE these medications   Athletes Foot 2 % powder Generic drug: miconazole Apply topically as needed for itching.   atomoxetine 60 MG capsule Commonly known as: STRATTERA TAKE ONE CAPSULE BY MOUTH EVERY DAY   sulfamethoxazole-trimethoprim 800-160 MG tablet Commonly known as: BACTRIM DS Take 1 tablet by mouth 2 (two) times daily. Started by: Elige Radon Vaidehi Braddy, MD        Objective:   BP 128/76   Pulse 90   Ht 5\' 10"  (1.778 m)   Wt 165 lb (74.8 kg)   SpO2 100%   BMI 23.68 kg/m   Wt Readings from Last 3 Encounters:  07/06/20 165 lb (74.8 kg)  03/18/19 150 lb (68 kg)  11/06/17 134 lb (60.8 kg)    Physical Exam Constitutional:      Appearance: Normal appearance.  Eyes:     Pupils: Pupils are equal, round, and reactive to light.  Cardiovascular:     Rate and Rhythm: Normal rate and regular rhythm.     Pulses: Normal pulses.     Heart sounds: Normal heart sounds.  Pulmonary:     Effort: Pulmonary effort is normal.     Breath sounds: Normal breath sounds.  Abdominal:  General: Abdomen is flat.     Palpations: Abdomen is soft.  Musculoskeletal:        General: Normal range of motion.  Skin:    General: Skin is warm and dry.     Findings: Abscess and erythema present. No rash.       Neurological:     General: No focal deficit present.     Mental Status: He is alert and oriented to person, place, and time.  Psychiatric:        Mood and Affect: Mood normal.        Behavior: Behavior normal.    Procedure  Performed by: Damaris Hippo Supervised by: Arville Care, M.D. Indications: recurrent fluctuant, painful, abscess Patient consent: Pt was informed of the indications, risks, and alternatives and expressed acknowledgement and understanding. Verbal consent was obtained. Location: Skin over the sternum 2 inches superior to the xyphoid process. Procedure: Pt was  positioned supine on exam table. Site was cleaned with alcohol swab. Anaesthesia was achieved with topical ethyl chloride spray. No. 11 blade scalpel was used to puncture the site of fluctuance just beneath the skin. The site drain of some blood but no purulence. The area was cleaned with guaze and a bandage was applied. Complications: 0.81mL blood loss Disposition: Pt tolerated the procedure well and expressed relief with release of pressure.    Assessment & Plan:   Problem List Items Addressed This Visit   None   Visit Diagnoses    Folliculitis    -  Primary       Follow up plan: Return if symptoms worsen or fail to improve.  Signs and symptoms consistent with folliculitis. The abscess was lanced and drained. Pt instructed to keep the site clean and replace bandage. He should avoid shaving that area from now on. Bactrim 1 tablet po bid  Counseling provided for all of the vaccine components No orders of the defined types were placed in this encounter.  Damaris Hippo 07/06/2020   I was personally present for all components of the history, physical exam and/or medical decision making.  I agree with the documentation performed by the PA student and agree with assessment and plan above. Sent bactrim for patient. PA student was [employer] Arville Care, MD [facility] 07/14/2020, 8:02 AM

## 2020-07-07 ENCOUNTER — Telehealth: Payer: Self-pay

## 2020-07-07 NOTE — Telephone Encounter (Signed)
He was given the antibiotic because of an infection in a tooth that requires a root canal.  He was given Augmentin 500 mg, take one TID.  He was also given Tramdol and Ibuprofen 800 mg.

## 2020-07-07 NOTE — Telephone Encounter (Signed)
He can hold the Bactrim and only take the  Amoxicillin.

## 2020-07-07 NOTE — Telephone Encounter (Signed)
Why was he given Amoxicillin? Is it Augmentin or Amoxicillin and the dose please.

## 2020-07-07 NOTE — Telephone Encounter (Signed)
Patient aware and verbalized understanding. °

## 2020-07-12 ENCOUNTER — Telehealth (INDEPENDENT_AMBULATORY_CARE_PROVIDER_SITE_OTHER): Payer: BC Managed Care – PPO | Admitting: Family

## 2020-07-12 ENCOUNTER — Encounter: Payer: Self-pay | Admitting: Family

## 2020-07-12 ENCOUNTER — Other Ambulatory Visit: Payer: Self-pay

## 2020-07-12 DIAGNOSIS — F902 Attention-deficit hyperactivity disorder, combined type: Secondary | ICD-10-CM | POA: Diagnosis not present

## 2020-07-12 DIAGNOSIS — Z7189 Other specified counseling: Secondary | ICD-10-CM

## 2020-07-12 DIAGNOSIS — Z719 Counseling, unspecified: Secondary | ICD-10-CM

## 2020-07-12 DIAGNOSIS — Z79899 Other long term (current) drug therapy: Secondary | ICD-10-CM | POA: Diagnosis not present

## 2020-07-12 DIAGNOSIS — R278 Other lack of coordination: Secondary | ICD-10-CM | POA: Diagnosis not present

## 2020-07-12 MED ORDER — ATOMOXETINE HCL 60 MG PO CAPS: 60.0000 mg | ORAL_CAPSULE | Freq: Every day | ORAL | 2 refills | Status: DC

## 2020-07-12 NOTE — Progress Notes (Addendum)
[facility] [address] [place] [place] [address] Dept: [phone] Dept Fax: [phone]  Medication Check visit via Virtual Video   Patient ID:  Antonio Rice  male DOB: 04-Oct-1995   25 y.o.   MRN: [id number]   DATE:07/12/20  PCP: Junie Spencer, FNP  Virtual Visit via Video Note  I connected with  [name] on 07/12/20 at  2:00 PM EST by a video enabled telemedicine application and verified that I am speaking with the correct person using two identifiers. Patient/Parent Location: at work   I discussed the limitations, risks, security and privacy concerns of performing an evaluation and management service by telephone and the availability of in person appointments. I also discussed with the parents that there may be a patient responsible charge related to this service. The parents expressed understanding and agreed to proceed.  Provider: Carron Curie, NP  Location: work location  HPI/CURRENT STATUS: Antonio Rice is here for medication management of the psychoactive medications for ADHD and review of educational and behavioral concerns.   Nijee currently taking Strattera 60 mg daily, which is working well. Takes medication at 5:30-6:00 am. Medication tends to last until the next dose. Alondra is able to focus through work.   Altariq is eating well (eating breakfast, lunch and dinner). Eating with no recent changes.   Sleeping well (goes to bed at 10:30 pm wakes at 5:00 am), sleeping through the night. No current concerns  EDUCATION/ WORK: Work: [employer] Hours: 6:00 am until 5:00 pm Monday through Friday Activities/ Exercise: Adult basketball league on Sunday, staying active at work.   Screen time: (phone, tablet, TV, computer): electronic devices on occasion due to work schedule.   MEDICAL HISTORY: Individual Medical History/ Review of Systems: Had root canal with Abx  for the past week. Also had ingrown nail and seen by PCP for treatment.   Family Medical/ Social History: Changes? None  Patient Lives with: at home  MENTAL HEALTH: Mental Health Issues:   None reported    Allergies: No Known Allergies  Current Medications:  Current Outpatient Medications  Medication Instructions  . amoxicillin (AMOXIL) 500 mg, Oral, Every 8 hours  . atomoxetine (STRATTERA) 60 mg, Oral, Daily  . miconazole (ATHLETES FOOT) 2 % powder Topical, As needed  . traMADol (ULTRAM) 50 mg, Oral, Every 6 hours PRN   Medication Side Effects: None  DIAGNOSES:    ICD-10-CM   1. ADHD (attention deficit hyperactivity disorder), combined type  F90.2   2. Dysgraphia  R27.8   3. Medication management  Z79.899   4. Patient counseled  Z71.9   5. Goals of care, counseling/discussion  Z71.89    ASSESSMENT: Patient tolerating current medication of Strattera 60 mg with no side effects. Patient reports coverage during the day with good efficacy for work. No reported concerns with dose and wanting to stay at the 60 mg dose. Not needing any accommodations or assistance at work to address his attention or written output needs. Working full-time at the same job as last f/u appointment with no concerns. Eating and sleeping with no concerns that need to be addressed. No adjustment to medication needed today.   PLAN/RECOMMENDATIONS:  Patient provided recent updates for medical f/u and changes since last f/u visit.  Not currently needing any assistance or accommodations at work for his learning or attention.   Has continued at the same place of employment from the last 3 months with no issues  reported.   Activity and exercise discussed outside of work for maintenance of health.   Eating and sleeping with no recent changes or concerns since last f/u visit.  Patient to continue with current schedule and structure for work and home settings with positive outcomes.   No need to recommend  individual or family counseling at this time.  Counseled medication pharmacokinetics, options, dosage, administration, desired effects, and possible side effects.   No changes to his Strattera and will continued at 60 mg daily, Rx sent today. Strattera 60 mg daily # 30 with 2 RF's.RX for above e-scribed and sent to pharmacy on record  [facility] [place], [place] - [address] [place] [address] Phone: [phone] Fax: [phone]  I discussed the assessment and treatment plan with the patient. The patient was provided an opportunity to ask questions and all were answered. The patient agreed with the plan and demonstrated an understanding of the instructions.   I provided 20 minutes of non-face-to-face time during this encounter.   Completed record review for 10 minutes prior to the virtual video visit.   NEXT APPOINTMENT:  10/27/2020  Return in about 3 months (around 10/09/2020) for f/u visit.  The patient was advised to call back or seek an in-person evaluation if the symptoms worsen or if the condition fails to improve as anticipated.   Carron Curie, NP

## 2020-10-27 ENCOUNTER — Encounter: Payer: Self-pay | Admitting: Family

## 2020-11-01 ENCOUNTER — Ambulatory Visit: Payer: BC Managed Care – PPO | Admitting: Family

## 2020-11-01 ENCOUNTER — Telehealth: Payer: Self-pay | Admitting: Family

## 2020-11-01 ENCOUNTER — Encounter: Payer: Self-pay | Admitting: Family

## 2020-11-01 ENCOUNTER — Other Ambulatory Visit: Payer: Self-pay

## 2020-11-01 VITALS — BP 110/68 | HR 68 | Resp 16 | Ht 68.75 in | Wt 154.0 lb

## 2020-11-01 DIAGNOSIS — F902 Attention-deficit hyperactivity disorder, combined type: Secondary | ICD-10-CM

## 2020-11-01 DIAGNOSIS — Z79899 Other long term (current) drug therapy: Secondary | ICD-10-CM

## 2020-11-01 DIAGNOSIS — R278 Other lack of coordination: Secondary | ICD-10-CM | POA: Diagnosis not present

## 2020-11-01 DIAGNOSIS — Z7189 Other specified counseling: Secondary | ICD-10-CM

## 2020-11-01 DIAGNOSIS — Z719 Counseling, unspecified: Secondary | ICD-10-CM

## 2020-11-01 MED ORDER — ATOMOXETINE HCL 60 MG PO CAPS: 60.0000 mg | ORAL_CAPSULE | Freq: Every day | ORAL | 2 refills | Status: DC

## 2020-11-01 NOTE — Progress Notes (Signed)
[facility] [address] [place] [address] Dept: [phone] Dept Fax: [phone] Loc: [phone] Loc Fax: [phone]  Medication Check  Patient ID: Antonio Rice, male  DOB: [birth year], 25 y.o.  MRN: [id number]  Date of Evaluation: 11/02/2020 PCP: Dettinger, Elige Radon, MD  Accompanied by: self Patient Lives with: parents  HISTORY/CURRENT STATUS: HPI Patient here by himself for the visit today. Patient doing well at work and just changed jobs now. 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 No issues with performing work related duties. NO changes reported since last f/u visit on 07/12/2020 by patient. Has continued with Strattera 60 mg daily with no side effects.   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   Sleep: Bedtime: 10:00 am  Awakens: 4:00 am  Concerns: Initiation/Maintenance/Other: None reported   Individual Medical History/ Review of Systems: Changes? :Yes root canal in January and February.   Allergies: Patient has no known allergies.  Current Medications: Current Outpatient Medications  Medication Instructions  . atomoxetine (STRATTERA) 60 mg, Oral, Daily  . cetirizine (ZYRTEC) 10 mg, Oral, Daily  . miconazole (ATHLETES FOOT) 2 % powder Topical, As needed   Medication Side Effects: None  Family Medical/ Social History: Changes? None reported   MENTAL HEALTH: Mental Health Issues: None   PHYSICAL EXAM; Vitals:  Vitals:   11/01/20 1456  BP: 110/68  Pulse: 68  Resp: 16  Height: 5' 8.75" (1.746 m)  Weight: 154 lb (69.9 kg)  BMI (Calculated): 22.91   General Physical  Exam: Unchanged from previous exam, date: 07/12/2020 Changed:None reported  DIAGNOSES:    ICD-10-CM   1. ADHD (attention deficit hyperactivity disorder), combined type  F90.2   2. Dysgraphia  R27.8   3. Medication management  Z79.899   4. Patient counseled  Z71.9   5. Goals of care, counseling/discussion  Z71.89    ASSESSMENT: Patient working with recent job changes due to monetary needs with better hours/schedule. Now working only 4 days/week with 3 day weekends. Patient driving for [employer] and needing letter for proof of safety without concerns for taking his Strattera with any side effects to perform his job. Strattera 60 mg has been effective with no side effects reported. No issues with needing assistance for his daily job duties. Patient to continue with Strattera with no change with medications and f/u in 3 months.   RECOMMENDATIONS:  Patient provided updates with work, change in jobs recently, pay change with new job and hours. Patient now driving and assisting with deliveries. Note provided to his current employer regarding use of his medication without any negative effects on his job performance.  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 [name] has done well with the transition and will continue with learning his new job as training progresses.   Discussed physical activity and requirements of the job with being physically demanding. Patient to get out more with nice weather and finding some other activity due to baseball ending. May consider golf this spring/summer for another sport.   Encouraged healthy eating habits that may include a daily MVI. A good variety of fruits and vegetables are recommended daily along with water for hydration, especially during the warmer  weather.   Discussed health updates and recent dental procedures. To continue with routine f/u care with PCP and regular dental visits.  Counseled medication  pharmacokinetics, options, dosage, administration, desired effects, and possible side effects.  Strattera 60 mg daily, # 30 with 2 RF's RX for above e-scribed and sent to pharmacy on record  [facility] [place], [place] - [address] [place] [address] Phone: [phone] Fax: [phone]  I discussed the assessment and treatment plan with the patient The patient was provided an opportunity to ask questions and all were answered. The patient agreed with the plan and demonstrated an understanding of the instructions.  NEXT APPOINTMENT: Return in about 3 months (around 02/01/2021) for f/u visit.  Carron Curie, NP Counseling Time: 30 Total Contact Time: 35

## 2020-11-02 ENCOUNTER — Encounter: Payer: Self-pay | Admitting: Family

## 2021-01-03 ENCOUNTER — Other Ambulatory Visit: Payer: Self-pay | Admitting: Family

## 2021-01-03 NOTE — Telephone Encounter (Signed)
Strattera 60 mg daily, # 30 with 2 RF's.RX for above e-scribed and sent to pharmacy on record  [facility] [place], [place] - [address] [place] [address] Phone: [phone] Fax: [phone]

## 2021-02-03 ENCOUNTER — Other Ambulatory Visit: Payer: Self-pay

## 2021-02-03 ENCOUNTER — Telehealth (INDEPENDENT_AMBULATORY_CARE_PROVIDER_SITE_OTHER): Payer: BC Managed Care – PPO | Admitting: Family

## 2021-02-03 ENCOUNTER — Encounter: Payer: Self-pay | Admitting: Family

## 2021-02-03 DIAGNOSIS — R278 Other lack of coordination: Secondary | ICD-10-CM | POA: Diagnosis not present

## 2021-02-03 DIAGNOSIS — F902 Attention-deficit hyperactivity disorder, combined type: Secondary | ICD-10-CM

## 2021-02-03 DIAGNOSIS — Z719 Counseling, unspecified: Secondary | ICD-10-CM | POA: Diagnosis not present

## 2021-02-03 DIAGNOSIS — Z79899 Other long term (current) drug therapy: Secondary | ICD-10-CM | POA: Diagnosis not present

## 2021-02-03 DIAGNOSIS — Z7189 Other specified counseling: Secondary | ICD-10-CM

## 2021-02-03 NOTE — Progress Notes (Signed)
[facility] [address], [place]. [address] [place] [address] Dept: [phone] Dept Fax: [phone]  Medication Check visit via Virtual Video   Patient ID:  Deran Barro  male DOB: 1995-09-17   25 y.o.   MRN: [id number]   DATE:02/03/21  PCP: Dettinger, Elige Radon, MD  Virtual Visit via Video Note  I connected with  [name] on 02/03/21 at  2:00 PM EDT by a video enabled telemedicine application and verified that I am speaking with the correct person using two identifiers. Patient/Parent Location: at home   Mr. rakim, moone are scheduled for a virtual visit with your provider today.    Just as we do with appointments in the office, we must obtain your consent to participate.  Your consent will be active for this visit and any virtual visit you may have with one of our providers in the next 365 days.    If you have a MyChart account, I can also send a copy of this consent to you electronically.  All virtual visits are billed to your insurance company just like a traditional visit in the office.  As this is a virtual visit, video technology does not allow for your provider to perform a traditional examination.  This may limit your provider's ability to fully assess your condition.  If your provider identifies any concerns that need to be evaluated in person or the need to arrange testing such as labs, EKG, etc, we will make arrangements to do so.    Although advances in technology are sophisticated, we cannot ensure that it will always work on either your end or our end.  If the connection with a video visit is poor, we may have to switch to a telephone visit.  With either a video or telephone visit, we are not always able to ensure that we have a secure connection.   I need to obtain your verbal consent now.   Are you willing to proceed with your visit today?   Braian Tijerina has provided verbal consent on  02/03/2021 for a virtual visit (video or telephone).   Carron Curie, NP 02/03/2021  8:37 AM     I discussed the limitations, risks, security and privacy concerns of performing an evaluation and management service by telephone and the availability of in person appointments. I also discussed with the parents that there may be a patient responsible charge related to this service. The parents expressed understanding and agreed to proceed.  Provider: Carron Curie, NP  Location: private work location  HPI/CURRENT STATUS: Ercell Razon is here for medication management of the psychoactive medications for ADHD and review of educational and behavioral concerns.   Wesam currently taking Strattera 60 mg, which is working well. Takes medication daily as directed. Medication tends to last fot the entire time needed during the day. Alexxander is able to focus through work.   Cung is eating well (eating breakfast, lunch and dinner). Eating with no current concerns.   Sleeping well (goes to bed at 9:30 pm wakes at 4:00 am), sleeping through the night. 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   Screen time: (phone, tablet, TV, computer): TV, phone and games  MEDICAL HISTORY: Individual Medical History/ Review of Systems: None reported recently. Health Screening by [employer] recently.   Family Medical/ Social History: Changes? None  reported GF and spending time with her for a few trips.  Patient Lives with: parents  MENTAL HEALTH: Mental Health Issues:    None reported recently.      Allergies: No Known Allergies  Current Medications:  Current Outpatient Medications on File Prior to Visit  Medication Sig Dispense Refill   atomoxetine (STRATTERA) 60 MG capsule Take 1 capsule (60 mg total) by mouth daily. 30 capsule 2    cetirizine (ZYRTEC) 10 MG tablet Take 10 mg by mouth daily.     miconazole (ATHLETES FOOT) 2 % powder Apply topically as needed for itching. 70 g 3   No current facility-administered medications on file prior to visit.   Medication Side Effects: None  DIAGNOSES:    ICD-10-CM   1. ADHD (attention deficit hyperactivity disorder), combined type  F90.2     2. Dysgraphia  R27.8     3. Medication management  Z79.899     4. Patient counseled  Z71.9     5. Goals of care, counseling/discussion  Z71.89      ASSESSMENT: Weylyn is a 25 year old male with a history of ADHD and Dysgraphia. Patient working at [employer] full time with 4 days of 10 hours/each day. 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 Has another driver with him during the day and getting any extra help when needed. Strattera 60 mg has been effective for work and other daily activities with no side effects. Lindberg has had no recent health issues. Sleeping schedule has shifted to waking earlier for work. Eating with no changes or concerns. To continued with current medication and f/u in 3 months for routine care.   PLAN/RECOMMENDATIONS:  Patient provided updates for work, health and medications since last f/u on 11/01/2020.  Patient has continued to work at [employer] with continued training for the past 3 months. Still has another driver for learning the routes, but no assistance needed for daily tasks outside of the scope of his duties.   No outside activities right now besides work on a regular basis. He participates in social activities and spending time with GF. To travel in the next few months to [place] and [place].   Suggested eating health with a variety of foods each day. A MVI may be taken for daily supplemental needs. Suggested more water or fluids each day.   Discussed health insurance changes recently. To establish routine healthcare needs on a regular basis.  Sleep schedule changes  with work discussed. Going to bed earlier and getting up earlier due to time change with work shift for current job. No difficulties reported.   Counseled medication pharmacokinetics, options, dosage, administration, desired effects, and possible side effects.   Strattera 60 mg daily, # 30 with 2 RF's.RX for above e-scribed and sent to pharmacy on record  [facility] [place], [place] - [address] [place] [address] Phone: [phone] Fax: [phone]  I discussed the assessment and treatment plan with the patient. The patient was provided an opportunity to ask questions and all were answered. The patient agreed with the plan and demonstrated an understanding of the instructions.   I provided 25 minutes of non-face-to-face time during this encounter. Completed record review for 10 minutes prior to the virtual video visit.   NEXT APPOINTMENT:  05/19/2021   Return in about 3 months (around 05/06/2021) for f/u visit.  The patient was advised to call back or  seek an in-person evaluation if the symptoms worsen or if the condition fails to improve as anticipated.   Carron Curie, NP

## 2021-04-03 ENCOUNTER — Other Ambulatory Visit: Payer: Self-pay | Admitting: Family

## 2021-04-03 NOTE — Telephone Encounter (Signed)
Strattera 60 mg daily, # 30 with 2 RF's.RX for above e-scribed and sent to pharmacy on record  [facility] [place], [place] - [address] [place] [address] Phone: [phone] Fax: [phone]     

## 2021-04-29 ENCOUNTER — Other Ambulatory Visit: Payer: Self-pay

## 2021-04-29 ENCOUNTER — Emergency Department
Admission: RE | Admit: 2021-04-29 | Discharge: 2021-04-29 | Disposition: A | Discharge status: Home / Self Care | Payer: BC Managed Care – PPO | Source: Ambulatory Visit

## 2021-05-19 ENCOUNTER — Telehealth (INDEPENDENT_AMBULATORY_CARE_PROVIDER_SITE_OTHER): Payer: BC Managed Care – PPO | Admitting: Family

## 2021-05-19 ENCOUNTER — Encounter: Payer: Self-pay | Admitting: Family

## 2021-05-19 ENCOUNTER — Other Ambulatory Visit: Payer: Self-pay

## 2021-05-19 DIAGNOSIS — Z7189 Other specified counseling: Secondary | ICD-10-CM

## 2021-05-19 DIAGNOSIS — R278 Other lack of coordination: Secondary | ICD-10-CM | POA: Diagnosis not present

## 2021-05-19 DIAGNOSIS — Z79899 Other long term (current) drug therapy: Secondary | ICD-10-CM | POA: Diagnosis not present

## 2021-05-19 DIAGNOSIS — Z719 Counseling, unspecified: Secondary | ICD-10-CM

## 2021-05-19 DIAGNOSIS — F902 Attention-deficit hyperactivity disorder, combined type: Secondary | ICD-10-CM

## 2021-05-19 NOTE — Progress Notes (Signed)
[facility] [address] [place]. [address] [place] [address] Dept: [phone] Dept Fax: [phone]  Medication Check visit via Virtual Video   Patient ID:  Antonio Rice  male DOB: 08/02/1995   25 y.o.   MRN: [id number]   DATE:05/19/21  PCP: Antonio Rice, Antonio Radon, Antonio Rice  Virtual Visit via Video Note  I connected with  Antonio Rice on 05/19/21 at 10:00 AM EST by a video enabled telemedicine application and verified that I am speaking with the correct person using two identifiers. Patient/Parent Location: at home   I discussed the limitations, risks, security and privacy concerns of performing an evaluation and management service by telephone and the availability of in person appointments. I also discussed with the parents that there may be a patient responsible charge related to this service. The parents expressed understanding and agreed to proceed.  Provider: Carron Curie, NP  Location: private work location.   HPI/CURRENT STATUS: Antonio Rice is here for medication management of the psychoactive medications for ADHD and review of educational and behavioral concerns.   Antonio Rice currently taking Strattera 60 mg daily, which is working well. Takes medication daily as directed with no side effects. Antonio Rice is able to focus through work.   Antonio Rice is eating well (eating breakfast, lunch and dinner). Antonio Rice does not have appetite suppression  Sleeping well (goes to bed at 9:30-10:00 pm wakes at 4:00 am), sleeping through the night. 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    MEDICAL HISTORY: Individual Medical History/ Review of Systems: Yes, COVID-19 a few weeks ago, with congestion, cough, and body aches. Has been  healthy with no visits to the PCP. WCC due yearly.   Family Medical/ Social History: Changes? None Patient Lives with: mother and father-shared custody  MENTAL HEALTH: Mental Health Issues:    None     Allergies: No Known Allergies  Current Medications:  Current Outpatient Medications on File Prior to Visit  Medication Sig Dispense Refill   atomoxetine (STRATTERA) 60 MG capsule TAKE ONE CAPSULE BY MOUTH EVERY DAY 30 capsule 2   cetirizine (ZYRTEC) 10 MG tablet Take 10 mg by mouth daily.     miconazole (ATHLETES FOOT) 2 % powder Apply topically as needed for itching. (Patient not taking: Reported on 05/19/2021) 70 g 3   No current facility-administered medications on file prior to visit.   Medication Side Effects: None  DIAGNOSES:    ICD-10-CM   1. ADHD (attention deficit hyperactivity disorder), combined type  F90.2     2. Dysgraphia  R27.8     3. Medication management  Z79.899     4. Patient counseled  Z71.9     5. Goals of care, counseling/discussion  Z71.89      ASSESSMENT:  Antonio Rice is a 25 year old male with a history of ADHD and Dysgraphia that is well controlled on Strattera 60 mg daily with no side effects. Has continued with working [employer] full-term working 4 days/week. 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 He is physically active with his routine and deliveries each day. Eating well with no recent changes. Sleep schedule with work has been consistent with no concerns. NO medical changes, but did contract COVID-19 when he was in [place] last month. No changes with medication or  dose and f/u in 3 months.   PLAN/RECOMMENDATIONS:  Updates for work, schedule, progress, health and medical updates since the last visit.   Working with no changes with his current job and route for driving. NO issues reported with his work or performance.   Will obtain his own route in the next year and getting assistance at this time for another  driver to make sure he is able to perform as needed once training is completed.   Eating well with no recent changes reported. Eating schedule depends daily on his schedule/route.   No changes in health care and no recent issues with health reported in the past several months.   Sleeping depends on work schedule and days off, but getting plenty of sleep each night.   Counseled medication pharmacokinetics, options, dosage, administration, desired effects, and possible side effects.   Strattera 60 mg daily, no Rx today New insurance and will call to transfer Rx to [facility] due to change in pharmacy coverage.   I discussed the assessment and treatment plan with the patient. The patient was provided an opportunity to ask questions and all were answered. The patient agreed with the plan and demonstrated an understanding of the instructions.   NEXT APPOINTMENT:  Visit date not found   3 month f/u visit  Telehealth OK  The patient was advised to call back or seek an in-person evaluation if the symptoms worsen or if the condition fails to improve as anticipated.  Carron Curie, NP

## 2021-06-23 ENCOUNTER — Ambulatory Visit: Payer: BC Managed Care – PPO | Admitting: Family Medicine

## 2021-06-23 ENCOUNTER — Encounter: Payer: Self-pay | Admitting: Family Medicine

## 2021-06-23 VITALS — BP 113/70 | HR 84 | Temp 98.4°F | Ht 69.0 in | Wt 162.0 lb

## 2021-06-23 DIAGNOSIS — L918 Other hypertrophic disorders of the skin: Secondary | ICD-10-CM

## 2021-06-23 NOTE — Progress Notes (Signed)
Subjective:  Patient ID: Antonio Rice, male    DOB: July 14, 1995, 26 y.o.   MRN: [id number]  Patient Care Team: Dettinger, Elige RadonJoshua A, MD as PCP - General (Family Medicine)   Chief Complaint:  skin tag removal   HPI: Antonio Rice is a 26 y.o. male presenting on 06/23/2021 for skin tag removal   Pt presents today for removal of an inflamed skin lesion to right groin. States this has been there for several years but has recently started becoming irritated and painful especially with exercise. He states his underwear rub the lesion and make the irritation worse. No bleeding, increased warmth, or drainage.        Relevant past medical, surgical, family, and social history reviewed and updated as indicated.  Allergies and medications reviewed and updated. Data reviewed: Chart in Epic.   Past Medical History:  Diagnosis Date   ADHD (attention deficit hyperactivity disorder)     Past Surgical History:  Procedure Laterality Date   TONSILLECTOMY      Social History   Socioeconomic History   Marital status: Single    Spouse name: Not on file   Number of children: Not on file   Years of education: Not on file   Highest education level: Not on file  Occupational History   Not on file  Tobacco Use   Smoking status: Never   Smokeless tobacco: Never  Vaping Use   Vaping Use: Never used  Substance and Sexual Activity   Alcohol use: No    Alcohol/week: 0.0 standard drinks   Drug use: No   Sexual activity: Not Currently  Other Topics Concern   Not on file  Social History Narrative   Not on file   Social Determinants of Health   Financial Resource Strain: Not on file  Food Insecurity: Not on file  Transportation Needs: Not on file  Physical Activity: Not on file  Stress: Not on file  Social Connections: Not on file  Intimate Partner Violence: Not on file    Outpatient Encounter Medications as of 06/23/2021  Medication Sig   atomoxetine (STRATTERA) 60 MG  capsule TAKE ONE CAPSULE BY MOUTH EVERY DAY   cetirizine (ZYRTEC) 10 MG tablet Take 10 mg by mouth daily.   miconazole (ATHLETES FOOT) 2 % powder Apply topically as needed for itching.   No facility-administered encounter medications on file as of 06/23/2021.    No Known Allergies  Review of Systems  Constitutional:  Negative for activity change, appetite change, chills, diaphoresis, fatigue, fever and unexpected weight change.  HENT: Negative.    Eyes: Negative.   Respiratory:  Negative for cough, chest tightness and shortness of breath.   Cardiovascular:  Negative for chest pain, palpitations and leg swelling.  Gastrointestinal:  Negative for blood in stool, constipation, diarrhea, nausea and vomiting.  Endocrine: Negative.   Genitourinary:  Negative for dysuria, frequency and urgency.  Musculoskeletal:  Negative for arthralgias and myalgias.  Skin:        Irritated skin lesion to right groin  Allergic/Immunologic: Negative.   Neurological:  Negative for dizziness and headaches.  Hematological: Negative.   Psychiatric/Behavioral:  Negative for confusion, hallucinations, sleep disturbance and suicidal ideas.   All other systems reviewed and are negative.      Objective:  BP 113/70    Pulse 84    Temp 98.4 F (36.9 C)    Ht 5\' 9"  (1.753 m)    Wt 162 lb (73.5 kg)    SpO2  100%    BMI 23.92 kg/m    Wt Readings from Last 3 Encounters:  06/23/21 162 lb (73.5 kg)  07/06/20 165 lb (74.8 kg)  03/18/19 150 lb (68 kg)    Physical Exam Vitals and nursing note reviewed.  Constitutional:      General: He is not in acute distress.    Appearance: Normal appearance. He is normal weight. He is not ill-appearing, toxic-appearing or diaphoretic.  HENT:     Head: Normocephalic and atraumatic.     Mouth/Throat:     Mouth: Mucous membranes are moist.  Eyes:     Conjunctiva/sclera: Conjunctivae normal.     Pupils: Pupils are equal, round, and reactive to light.  Cardiovascular:     Rate  and Rhythm: Normal rate and regular rhythm.     Heart sounds: Normal heart sounds.  Pulmonary:     Effort: Pulmonary effort is normal.  Musculoskeletal:     Right lower leg: No edema.     Left lower leg: No edema.  Skin:    General: Skin is warm and dry.     Capillary Refill: Capillary refill takes less than 2 seconds.     Findings: Lesion present.       Neurological:     General: No focal deficit present.     Mental Status: He is alert and oriented to person, place, and time.  Psychiatric:        Mood and Affect: Mood normal.        Behavior: Behavior normal.        Thought Content: Thought content normal.        Judgment: Judgment normal.    Results for orders placed or performed during the hospital encounter of 04/15/20  C. trachomatis/N. gonorrhoeae RNA (GC/Chlamydia)  Result Value Ref Range   C. trachomatis RNA, TMA NOT DETECTED NOT DETECT   N. gonorrhoeae RNA, TMA NOT DETECTED NOT DETECT     Skin excision  Date/Time: 06/23/2021 9:35 AM Performed by: Sonny Masters, FNP Authorized by: Sonny Masters, FNP   Number of Lesions: 1 Lesion 1:    Body area: lower extremity   Lower extremity location: right groin.   Initial size (mm): 4   Final defect size (mm): 4   Malignancy: malignancy unknown     Destruction method comment: 11 blade, single cut   Pertinent labs & imaging results that were available during my care of the patient were reviewed by me and considered in my medical decision making.  Assessment & Plan:  Antonio Rice was seen today for skin tag removal.  Diagnoses and all orders for this visit:  Inflamed skin tag Skin tag removed in office, pt tolerated well. After care discussed in detail. Pt aware to monitor for signs of infection. Follow up if needed.     Continue all other maintenance medications.  Follow up plan: Return if symptoms worsen or fail to improve.   Continue healthy lifestyle choices, including diet (rich in fruits, vegetables, and  lean proteins, and low in salt and simple carbohydrates) and exercise (at least 30 minutes of moderate physical activity daily).  Educational handout given for lesion removal after care.   The above assessment and management plan was discussed with the patient. The patient verbalized understanding of and has agreed to the management plan. Patient is aware to call the clinic if they develop any new symptoms or if symptoms persist or worsen. Patient is aware when to return to the clinic for  a follow-up visit. Patient educated on when it is appropriate to go to the emergency department.   Kari Baars, FNP-C [facility] [phone]

## 2021-07-04 ENCOUNTER — Other Ambulatory Visit: Payer: Self-pay | Admitting: Family

## 2021-07-04 NOTE — Telephone Encounter (Signed)
Strattera 60 mg daily, # 30 with 2 RF's.RX for above e-scribed and sent to pharmacy on record  [facility] - [place], [place] - [address] [place] [address] Phone: [phone] Fax: [phone]     

## 2021-08-01 ENCOUNTER — Telehealth: Payer: Self-pay | Admitting: Family

## 2021-08-01 NOTE — Telephone Encounter (Signed)
Antonio Rice called stated he needed a refill on Antonio Rice 60mg ,[address] [place]  Phone:  [phone]

## 2021-08-04 NOTE — Telephone Encounter (Signed)
Patient requested RF and has RF's on file for Strattera 60 mg daily sent on 07/04/2021.

## 2021-08-31 ENCOUNTER — Ambulatory Visit: Payer: BC Managed Care – PPO | Admitting: Family

## 2021-08-31 ENCOUNTER — Other Ambulatory Visit: Payer: Self-pay

## 2021-08-31 ENCOUNTER — Encounter: Payer: Self-pay | Admitting: Family

## 2021-08-31 VITALS — BP 112/68 | HR 76 | Resp 16 | Ht 68.7 in | Wt 158.0 lb

## 2021-08-31 DIAGNOSIS — Z719 Counseling, unspecified: Secondary | ICD-10-CM

## 2021-08-31 DIAGNOSIS — R278 Other lack of coordination: Secondary | ICD-10-CM | POA: Diagnosis not present

## 2021-08-31 DIAGNOSIS — Z79899 Other long term (current) drug therapy: Secondary | ICD-10-CM | POA: Diagnosis not present

## 2021-08-31 DIAGNOSIS — F902 Attention-deficit hyperactivity disorder, combined type: Secondary | ICD-10-CM | POA: Diagnosis not present

## 2021-08-31 DIAGNOSIS — Z7189 Other specified counseling: Secondary | ICD-10-CM

## 2021-08-31 MED ORDER — ATOMOXETINE HCL 60 MG PO CAPS: 60.0000 mg | ORAL_CAPSULE | Freq: Every day | ORAL | 0 refills | Status: DC

## 2021-08-31 NOTE — Progress Notes (Signed)
?[facility] ?[facility] ?[facility] ?[address], [address] ?[place] [address] ?Dept: [phone] ?Dept Fax: [phone] ?Loc: [phone] ?Loc Fax: [phone] ? ?Medication Check ? ?Patient ID: Antonio Rice, male  DOB: [birth year], 26 y.o.  MRN: [id number] ? ?Date of Evaluation: 08/31/2021 ?PCP: Dettinger, Antonio Radon, MD ? ?Accompanied by:  self ?Patient Lives with:  house by himself and GF stays on the weekends ? ?HISTORY/CURRENT STATUS: ?HPI Patient here by himself for the visit today. Patient interactive and appropriate with provider. Patient doing well at work with no issues. Has continued with Strattera 60 mg daily, no side effects and good efficacy.  ? 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  ? ?MEDICAL HISTORY: ?Appetite: None  MVI/Other: None   ? ?Sleep: Bedtime: 9:30 pm  Awakens: 4:00 am  Concerns: Initiation/Maintenance/Other: No issues reported.  ? ?Individual Medical History/ Review of Systems: Changes? :None reported ? ?Allergies: Patient has no known allergies. ? ?Current Medications: ?Current Outpatient Medications  ?Medication Instructions  ? atomoxetine (STRATTERA) 60 mg, Oral, Daily  ? cetirizine (ZYRTEC) 10 mg, Oral, Daily  ? miconazole (ATHLETES FOOT) 2 % powder Topical, As needed  ? ?Medication Side Effects: None ?Family Medical/ Social History: Changes? None reported ? ?MENTAL HEALTH: ?Mental Health Issues: None reported  ? ?PHYSICAL EXAM; ?Vitals:  ?Vitals:  ? 08/31/21 1503  ?BP: 112/68  ?Pulse: 76  ?Resp: 16  ?Weight: 158 lb (71.7 kg)  ?Height: 5' 8.7" (1.745 m)  ?  ?General Physical Exam: ?Unchanged from previous exam, date:05/19/2021 Changed:None ? ?DIAGNOSES:  ?  ICD-10-CM   ?1. ADHD (attention deficit hyperactivity disorder), combined type  F90.2   ?  ?2.  Dysgraphia  R27.8   ?  ?3. Medication management  Z79.899   ?  ?4. Patient counseled  Z71.9   ?  ?5. Goals of care, counseling/discussion  Z71.89   ?  ? ?ASSESSMENT: ?[name] is a 26 year old male with a history of ADHD and Dysgraphia. He has been maintained on Strattera 60 mg daily with good efficacy and no side effects. 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 Has 1 day off each week and working 4 days/week. Staying active with work with physical demands of the job. Eating well with no concerns. Sleep schedule has been consistent with no recent concerns. No medical changes or injuries in the past few months. Will continue with his Strattera 60 mg with no changes today.  ? ?RECOMMENDATIONS:  ?Updates with work, schedule and current days of work with his job. ? 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 Supported his efforts and responsibilities.  ? ?No concerns or issues with his current job performance. 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 ? ?Eating well with no issues reported and eating more with activity. ? ?Staying physically active with work and his physical demand so the job.  ? ?Sleep schedule has shifted for work hours but no current concerns today. ? ?Moved out by himself to a house with GF staying on the weekends. Support given for independence.  ? ?Counseled medication pharmacokinetics, options, dosage, administration, desired effects, and possible side effects.   ?Strattera  60 mg daily, # 90 with no RF's.RX for above e-scribed and sent to pharmacy on record ? ?[id number] - [place], [place] - [address] ?[address] ?[place] [address] ?Phone: [phone] Fax: [phone] ? ?I discussed the assessment and treatment plan with the patient. The patient was provided an opportunity to ask  questions and all were answered. The patient agreed with the plan and demonstrated an understanding of the instructions. ? ?NEXT APPOINTMENT: Return in about 3 months (around 12/01/2021) for f/u visit . ? ?The patient was advised to call back or seek an in-person evaluation if the symptoms worsen or if the condition fails to improve as anticipated. ? ?Carron Curie, NP  ? ?

## 2021-09-01 ENCOUNTER — Telehealth: Payer: Self-pay

## 2021-09-01 NOTE — Telephone Encounter (Signed)
Outcome  Approvedtoday  Your PA request has been approved. Additional information will be provided in the approval communication. (Message 1145)

## 2021-12-01 ENCOUNTER — Telehealth (INDEPENDENT_AMBULATORY_CARE_PROVIDER_SITE_OTHER): Payer: BC Managed Care – PPO | Admitting: Family

## 2021-12-01 ENCOUNTER — Encounter: Payer: Self-pay | Admitting: Family

## 2021-12-01 DIAGNOSIS — F902 Attention-deficit hyperactivity disorder, combined type: Secondary | ICD-10-CM

## 2021-12-01 DIAGNOSIS — Z79899 Other long term (current) drug therapy: Secondary | ICD-10-CM | POA: Diagnosis not present

## 2021-12-01 DIAGNOSIS — Z719 Counseling, unspecified: Secondary | ICD-10-CM | POA: Diagnosis not present

## 2021-12-01 DIAGNOSIS — R278 Other lack of coordination: Secondary | ICD-10-CM | POA: Diagnosis not present

## 2021-12-01 DIAGNOSIS — Z7189 Other specified counseling: Secondary | ICD-10-CM

## 2021-12-01 MED ORDER — ATOMOXETINE HCL 60 MG PO CAPS: 60.0000 mg | ORAL_CAPSULE | Freq: Every day | ORAL | 0 refills | Status: DC

## 2021-12-01 NOTE — Progress Notes (Signed)
[facility] [address] [place] [place] [address] Dept: [phone] Dept Fax: [phone]  Medication Check visit via Virtual Video   Patient ID:  Antonio Rice  male DOB: Jan 10, 1996   26 y.o.   MRN: [id number]   DATE:12/01/21  PCP: Dettinger, Elige Radon, MD  Virtual Visit via Video Note  I connected with  Antonio Rice on 12/01/21 at  9:00 AM EDT by a video enabled telemedicine application and verified that I am speaking with the correct person using two identifiers. Patient/Parent Location: at home  I discussed the limitations, risks, security and privacy concerns of performing an evaluation and management service by telephone and the availability of in person appointments. I also discussed with the parents that there may be a patient responsible charge related to this service. The parents expressed understanding and agreed to proceed.  Provider: Carron Curie, NP  Location: private work location  HPI/CURRENT STATUS: Antonio Rice is here for medication management of the psychoactive medications for ADHD and review of educational and behavioral concerns.   Antonio Rice currently taking Strattera 60 mg daily, which is working well. Takes medication daily at the same time. Medication tends to last until the next dose. Antonio Rice is able to focus through work.   Antonio Rice is eating well (eating breakfast, lunch and dinner). Antonio Rice does not have appetite suppression, no changes or concern.   Sleeping well (goes to bed at 9-10:00 pm wakes at 4:30 am), sleeping through the night. 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 WCC due yearly with blood  work.   Family Medical/ Social History: None reported Patient Lives with:  GF in a house with 2 dogs  MENTAL HEALTH: Mental Health Issues: None reported recently.   Allergies: No Known Allergies  Current Medications:  Current Outpatient Medications on File Prior to Visit  Medication Sig Dispense Refill   cetirizine (ZYRTEC) 10 MG tablet Take 10 mg by mouth daily.     miconazole (ATHLETES FOOT) 2 % powder Apply topically as needed for itching. (Patient not taking: Reported on 08/31/2021) 70 g 3   No current facility-administered medications on file prior to visit.   Medication Side Effects: None  DIAGNOSES:    ICD-10-CM   1. ADHD (attention deficit hyperactivity disorder), combined type  F90.2     2. Dysgraphia  R27.8     3. Medication management  Z79.899     4. Patient counseled  Z71.9     5. Goals of care, counseling/discussion  Z71.89      ASSESSMENT:    Antonio Rice is a 26 year old male with a history of ADHD and Dysgraphia. He has been well maintained on Strattera 60 mg daily with no side effects and daily efficacy. 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 No issues with work related duties. Has had no changes with eating, sleeping or health in the past 3 months. Working on home projects with recent move to a house with his GF. No changes today and reassess in 3 months.   PLAN/RECOMMENDATIONS:  Patient provided updates for work and current schedule with possible change with his own route  Work and  progress discussed with 1 year at the company and training to begin for his own route.  Medical updates with no changes and no family medical changes reported.   Eating with no changes and healthy eating habits suggested with plenty of water daily.  Work and home projects along with outside activities have provided regular exercise.  Sleep routine and sleep hygiene discussed for adequate sleep each night.   Medication  management discussed for his current diagnosis with no changes needed.  Counseled medication pharmacokinetics, options, dosage, administration, desired effects, and possible side effects.   Strattera 60 mg daily, # 90 with no RF's.RX for above e-scribed and sent to pharmacy on record  [id number] - [place], [place] - [address] [place] [address] Phone: [phone] Fax: [phone]  I discussed the assessment and treatment plan with the patient. The patient was provided an opportunity to ask questions and all were answered. The patient agreed with the plan and demonstrated an understanding of the instructions.   NEXT APPOINTMENT:  Visit date not found-f/u visit for 3 month routine    Telehealth OK  The patient was advised to call back or seek an in-person evaluation if the symptoms worsen or if the condition fails to improve as anticipated.  Carron Curie, NP

## 2021-12-03 ENCOUNTER — Encounter: Payer: Self-pay | Admitting: Family

## 2022-03-02 ENCOUNTER — Other Ambulatory Visit: Payer: Self-pay | Admitting: Family

## 2022-03-02 ENCOUNTER — Telehealth: Payer: BC Managed Care – PPO | Admitting: Family

## 2022-03-02 MED ORDER — ATOMOXETINE HCL 60 MG PO CAPS: 60.0000 mg | ORAL_CAPSULE | Freq: Every day | ORAL | 0 refills | Status: DC

## 2022-03-02 NOTE — Telephone Encounter (Signed)
RX for above e-scribed and sent to pharmacy on record ? ?[id number] - [place], [place] - [address] ?[address] ?[place] [address] ?Phone: [phone] Fax: [phone] ?

## 2022-05-18 ENCOUNTER — Encounter: Payer: Self-pay | Admitting: Family

## 2022-05-18 ENCOUNTER — Telehealth (INDEPENDENT_AMBULATORY_CARE_PROVIDER_SITE_OTHER): Payer: BC Managed Care – PPO | Admitting: Family

## 2022-05-18 DIAGNOSIS — Z79899 Other long term (current) drug therapy: Secondary | ICD-10-CM | POA: Diagnosis not present

## 2022-05-18 DIAGNOSIS — Z719 Counseling, unspecified: Secondary | ICD-10-CM

## 2022-05-18 DIAGNOSIS — F902 Attention-deficit hyperactivity disorder, combined type: Secondary | ICD-10-CM

## 2022-05-18 DIAGNOSIS — Z7189 Other specified counseling: Secondary | ICD-10-CM | POA: Diagnosis not present

## 2022-05-18 DIAGNOSIS — R278 Other lack of coordination: Secondary | ICD-10-CM | POA: Diagnosis not present

## 2022-05-18 MED ORDER — ATOMOXETINE HCL 60 MG PO CAPS: 60.0000 mg | ORAL_CAPSULE | Freq: Every day | ORAL | 0 refills | Status: DC

## 2022-05-18 NOTE — Progress Notes (Signed)
[facility] [address] [place] [place] [address] Dept: [phone] Dept Fax: [phone]  Medication Check visit via Virtual Video   Patient ID:  Antonio Rice  male DOB: 1996-03-23   26 y.o.   MRN: [id number]   DATE:05/18/22  PCP: Dettinger, Elige Radon, MD  Virtual Visit via Video Note I connected with  Antonio Rice on 05/18/22 at 11:00 AM EST by a video enabled telemedicine application and verified that I am speaking with the correct person using two identifiers. Patient/Parent Location: private location  I discussed the limitations, risks, security and privacy concerns of performing an evaluation and management service by telephone and the availability of in person appointments. I also discussed with the parents that there may be a patient responsible charge related to this service. The parents expressed understanding and agreed to proceed.  Provider: Carron Curie, NP  Location: private work location  HPI/CURRENT STATUS: Antonio Rice is here for medication management of the psychoactive medications for ADHD and review of educational and behavioral concerns.   Day currently taking Strattera 60 mg daily, which is working well. Takes medication at 5:00 am. Medication tends to last until his next dose. Antonio Rice is able to focus through work.   Antonio Rice is eating well (eating breakfast, lunch and dinner). Antonio Rice does not have appetite suppression  Sleeping well (getting enough sleep), sleeping through the night. Antonio Rice does not have delayed sleep onset and getting plenty of sleep.  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  Has been healthy with no visits to the PCP. WCC due yearly.    Family Medical/ Social History:  Antonio Rice Lives with:  GF and 2 dogs  MENTAL HEALTH: Mental Health Issues:  None      Allergies: No Known Allergies  Current Medications:  Current Outpatient Medications  Medication Instructions   atomoxetine (STRATTERA) 60 mg, Oral, Daily   cetirizine (ZYRTEC) 10 mg, Daily   miconazole (ATHLETES FOOT) 2 % powder Topical, As needed   Medication Side Effects: None  DIAGNOSES:    ICD-10-CM   1. ADHD (attention deficit hyperactivity disorder), combined type  F90.2     2. Dysgraphia  R27.8     3. Medication management  Z79.899     4. Patient counseled  Z71.9     5. Goals of care, counseling/discussion  Z71.89     ASSESSMENT:      Antonio Rice is a 26 year old male with a history of ADHD and Anxiety. Patient has continued to take his Strattera 60 mg daily with side effects and good efficacy reported. 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 No current issues reported by patient with his work duties and progression. No medical changes reported. Eating and sleeping well with no concerns. Staying active with daily work duties. Continue current medication and dosing.  PLAN/RECOMMENDATIONS:  Discussed recent changes with work, health, and family updates.  Updates with work and current progress with his opportunity to obtain his own routine after the 1st of the year.  Medical updates with no changes reported with no issues since last f/u visit.   Eating well with no changes and encouraged health eating habits with more water encouraged.  Work and home changes discussed with plenty of activity.  Sleep schedule discussed with no issues reported and getting plenty of sleep.   Medication management discussed with patient with continued success.  Counseled medication pharmacokinetics, options, dosage, administration, desired effects, and possible side effects.   Strattera 60 mg daily, #90 with no RF's RX for above  e-scribed and sent to pharmacy on record  [id number] - [place], [place] - [address] [place] [address] Phone: [phone] Fax: [phone]   I discussed the assessment and treatment plan with [facility] Antonio Rice was provided an opportunity to ask questions and all were answered. Antonio Rice agreed with the plan and demonstrated an understanding of the instructions.  REVIEW OF CHART, FACE TO FACE CLINIC TIME AND DOCUMENTATION TIME DURING TODAY'S VISIT:  25 mins      NEXT APPOINTMENT: F/u as needed. Telehealth OK  The patient was advised to call back or seek an in-person evaluation if the symptoms worsen or if the condition fails to improve as anticipated.  Carron Curie, NP

## 2022-05-26 ENCOUNTER — Other Ambulatory Visit: Payer: Self-pay | Admitting: Pediatrics

## 2022-06-14 ENCOUNTER — Ambulatory Visit: Admit: 2022-06-14 | Discharge status: Absent without leave | Payer: BC Managed Care – PPO

## 2022-06-15 DIAGNOSIS — R051 Acute cough: Secondary | ICD-10-CM | POA: Diagnosis not present

## 2022-08-10 DIAGNOSIS — M25572 Pain in left ankle and joints of left foot: Secondary | ICD-10-CM | POA: Diagnosis not present

## 2022-08-10 DIAGNOSIS — S93402A Sprain of unspecified ligament of left ankle, initial encounter: Secondary | ICD-10-CM | POA: Diagnosis not present

## 2022-08-16 ENCOUNTER — Other Ambulatory Visit: Payer: Self-pay | Admitting: Family

## 2022-08-24 ENCOUNTER — Other Ambulatory Visit: Payer: Self-pay

## 2022-08-24 ENCOUNTER — Ambulatory Visit: Payer: BC Managed Care – PPO | Attending: Student

## 2022-08-24 DIAGNOSIS — M25572 Pain in left ankle and joints of left foot: Secondary | ICD-10-CM | POA: Diagnosis not present

## 2022-08-24 NOTE — Therapy (Addendum)
OUTPATIENT PHYSICAL THERAPY LOWER EXTREMITY EVALUATION   Patient Name: Antonio Rice MRN: [id number] DOB:Apr 14, 1996, 27 y.o., male Today's Date: 08/24/2022  END OF SESSION:  PT End of Session - 08/24/22 0941     Visit Number 1    Number of Visits 1    Date for PT Re-Evaluation 08/27/22    PT Start Time 0902    PT Stop Time 0938    PT Time Calculation (min) 36 min    Activity Tolerance Patient tolerated treatment well    Behavior During Therapy [facility] for tasks assessed/performed             Past Medical History:  Diagnosis Date   ADHD (attention deficit hyperactivity disorder)    Past Surgical History:  Procedure Laterality Date   TONSILLECTOMY     Patient Active Problem List   Diagnosis Date Noted   Medication management 11/11/2018   ADHD (attention deficit hyperactivity disorder), combined type 08/10/2015   Dysgraphia 08/10/2015    PCP: Dettinger, Fransisca Kaufmann, MD  REFERRING PROVIDER: Corky Sing, PA-C  REFERRING DIAG: Sprain of unspecified ligament of left ankle, initial encounter  THERAPY DIAG:  Pain in left ankle and joints of left foot  Rationale for Evaluation and Treatment: Rehabilitation  ONSET DATE: 2 to 3 weeks ago  SUBJECTIVE:   SUBJECTIVE STATEMENT: Patient reports that he sprained his left ankle about 2-3 weeks ago. He feels that this weeks is better than last week. He has been doing some exercises at home such as ankle circles, alphabets along with ice and elevation. He has sprained this ankle multiple times. The last time was about a year ago, but it got better after a week or two. He ankle is still sensitive to the touch.   PERTINENT HISTORY: ADHD PAIN:  Are you having pain? Yes: NPRS scale: 5-6/10 Pain location: left lateral ankle Pain description: intermittent sharp pain  Aggravating factors: prolonged standing, stairs, and walking Relieving factors: ice and exercise  PRECAUTIONS: None  WEIGHT BEARING RESTRICTIONS:  No  FALLS:  Has patient fallen in last 6 months? No  OCCUPATION: [employer] driver  PLOF: Independent  PATIENT GOALS: get a HEP   OBJECTIVE:   COGNITION: Overall cognitive status: Within functional limits for tasks assessed     SENSATION: Patient reports no numbness or tingling   PALPATION: TTP: calcaneofibular ligament  JOINT MOBILITY: Mild hypomobility and discomfort at left distal fibula    LOWER EXTREMITY ROM:  Active ROM Right eval Left eval  Hip flexion    Hip extension    Hip abduction    Hip adduction    Hip internal rotation    Hip external rotation    Knee flexion    Knee extension    Ankle dorsiflexion 5 5  Ankle plantarflexion 51 42  Ankle inversion 32 24  Ankle eversion 12 9; discomfort   (Blank rows = not tested)  LOWER EXTREMITY MMT:  MMT Right eval Left eval  Hip flexion    Hip extension    Hip abduction    Hip adduction    Hip internal rotation    Hip external rotation    Knee flexion    Knee extension    Ankle dorsiflexion 4/5 5/5  Ankle plantarflexion    Ankle inversion 4+/5 4+/5  Ankle eversion 5/5 5/5   (Blank rows = not tested)  GAIT: Assistive device utilized: None Level of assistance: Complete Independence Comments: No significant gait deviations   TODAY'S TREATMENT:  DATE:                                     08/24/22 EXERCISE LOG  Exercise Repetitions and Resistance Comments  Resisted PF, DF, IV, and EV Blue t-band x 15 reps each    Standing gastroc and soleus stretch 2 x 30 seconds each   SLS on foam  2 x 30 seconds    SL heel raise  20 reps        Blank cell = exercise not performed today   PATIENT EDUCATION:  Education details: HEP, and benefits of exercise Person educated: Patient Education method: Explanation, Verbal cues, and Handouts Education comprehension: verbalized understanding  and returned demonstration  HOME EXERCISE PROGRAM: [id number]  ASSESSMENT:  CLINICAL IMPRESSION: Patient is a 27 y.o. male who was seen today for physical therapy evaluation and treatment for a left ankle sprain.  He presented with low pain severity and irritability with left ankle eversion being the most aggravating to his familiar symptoms.  He exhibited a slight reduction in left ankle active range of motion compared to the right.  He was provided an HEP to address the identified impairments which she was able to properly perform.  He reported feeling comfortable with these interventions and being discharged from therapy at this time.  PHYSICAL THERAPY DISCHARGE SUMMARY  Visits from Start of Care: 1  Current functional level related to goals / functional outcomes: Patient was provided a HEP and felt comfortable being discharged    Remaining deficits: See evaluation    Education / Equipment: HEP    Patient agrees to discharge. Patient goals were met. Patient is being discharged due to meeting the stated rehab goals.   OBJECTIVE IMPAIRMENTS: decreased ROM, decreased strength, hypomobility, and pain.   ACTIVITY LIMITATIONS: standing, stairs, and locomotion level  PARTICIPATION LIMITATIONS: occupation  PERSONAL FACTORS: Profession and 1 comorbidity: ADHD  are also affecting patient's functional outcome.   REHAB POTENTIAL: Good  CLINICAL DECISION MAKING: Stable/uncomplicated  EVALUATION COMPLEXITY: Low   GOALS: None, evaluation only  PLAN:  PT FREQUENCY: one time visit  PT DURATION: 1 week  PLANNED INTERVENTIONS: Therapeutic exercises, Therapeutic activity, Neuromuscular re-education, Balance training, Patient/Family education, and Re-evaluation  PLAN FOR NEXT SESSION: None, evaluation only   Darlin Coco, PT 08/24/2022, 1:44 PM

## 2022-08-29 ENCOUNTER — Ambulatory Visit: Payer: BC Managed Care – PPO | Admitting: Family Medicine

## 2022-08-30 ENCOUNTER — Encounter: Payer: Self-pay | Admitting: Family Medicine

## 2022-08-30 ENCOUNTER — Other Ambulatory Visit (HOSPITAL_COMMUNITY)
Admission: RE | Admit: 2022-08-30 | Discharge: 2022-08-30 | Disposition: A | Discharge status: Home / Self Care | Payer: BC Managed Care – PPO | Source: Ambulatory Visit | Attending: Family Medicine | Admitting: Family Medicine

## 2022-08-30 ENCOUNTER — Ambulatory Visit (INDEPENDENT_AMBULATORY_CARE_PROVIDER_SITE_OTHER): Payer: BC Managed Care – PPO | Admitting: Family Medicine

## 2022-08-30 VITALS — BP 118/76 | HR 82 | Ht 68.0 in | Wt 160.0 lb

## 2022-08-30 DIAGNOSIS — Z23 Encounter for immunization: Secondary | ICD-10-CM | POA: Diagnosis not present

## 2022-08-30 DIAGNOSIS — Z0001 Encounter for general adult medical examination with abnormal findings: Secondary | ICD-10-CM

## 2022-08-30 DIAGNOSIS — F902 Attention-deficit hyperactivity disorder, combined type: Secondary | ICD-10-CM

## 2022-08-30 DIAGNOSIS — R278 Other lack of coordination: Secondary | ICD-10-CM | POA: Diagnosis not present

## 2022-08-30 DIAGNOSIS — Z114 Encounter for screening for human immunodeficiency virus [HIV]: Secondary | ICD-10-CM | POA: Diagnosis not present

## 2022-08-30 DIAGNOSIS — Z0000 Encounter for general adult medical examination without abnormal findings: Secondary | ICD-10-CM

## 2022-08-30 DIAGNOSIS — Z202 Contact with and (suspected) exposure to infections with a predominantly sexual mode of transmission: Secondary | ICD-10-CM | POA: Diagnosis not present

## 2022-08-30 DIAGNOSIS — Z7289 Other problems related to lifestyle: Secondary | ICD-10-CM | POA: Diagnosis not present

## 2022-08-30 MED ORDER — ATOMOXETINE HCL 60 MG PO CAPS: 60.0000 mg | ORAL_CAPSULE | Freq: Every day | ORAL | 1 refills | Status: DC

## 2022-08-30 NOTE — Addendum Note (Signed)
Addended by: Alphonzo Dublin on: 08/30/2022 04:31 PM   Modules accepted: Orders

## 2022-08-30 NOTE — Progress Notes (Signed)
BP 118/76   Pulse 82   Ht 5\' 8"  (1.727 m)   Wt 160 lb (72.6 kg)   SpO2 100%   BMI 24.33 kg/m    Subjective:   Patient ID: Antonio Rice, male    DOB: [birth year], 27 y.o.   MRN: [id number]  HPI: Antonio Rice is a 27 y.o. male presenting on 08/30/2022 for Medical Management of Chronic Issues (CPE) and ADHD (Request refills of Straterra)   HPI Adult well exam Patient denies any chest pain, shortness of breath, headaches or vision issues, abdominal complaints, diarrhea, nausea, vomiting, or joint issues.   Adhd Patient has been taking Strattera for quite some time, his psychologist is retiring and he has been on the medicine for quite some time.  Has been treated for ADHD since he was 9.  He says the medication does well for him but denies any major issues.  He does also look like he has been diagnosed with anxiety through them and this helps as well with that.  Patient has had 1 sexual partner in the past year but has had different sexual partners in the past and he wants to go ahead and do STD screening for possible risk of exposure.  He uses condoms some of the time but not all the time.  He denies any symptoms.  Relevant past medical, surgical, family and social history reviewed and updated as indicated. Interim medical history since our last visit reviewed. Allergies and medications reviewed and updated.  Review of Systems  Constitutional:  Negative for chills and fever.  HENT:  Negative for ear pain and tinnitus.   Eyes:  Negative for pain and discharge.  Respiratory:  Negative for cough, shortness of breath and wheezing.   Cardiovascular:  Negative for chest pain, palpitations and leg swelling.  Gastrointestinal:  Negative for abdominal pain, blood in stool, constipation and diarrhea.  Genitourinary:  Negative for dysuria and hematuria.  Musculoskeletal:  Negative for back pain, gait problem and myalgias.  Skin:  Negative for rash.  Neurological:  Negative for  dizziness, weakness and headaches.  Psychiatric/Behavioral:  Negative for suicidal ideas.   All other systems reviewed and are negative.   Per HPI unless specifically indicated above   Allergies as of 08/30/2022   No Known Allergies      Medication List        Accurate as of August 30, 2022  9:56 AM. If you have any questions, ask your nurse or doctor.          Athletes Foot 2 % powder Generic drug: miconazole Apply topically as needed for itching. What changed: how much to take   atomoxetine 60 MG capsule Commonly known as: STRATTERA Take 1 capsule (60 mg total) by mouth daily.   cetirizine 10 MG tablet Commonly known as: ZYRTEC Take 10 mg by mouth daily as needed for allergies.         Objective:   BP 118/76   Pulse 82   Ht 5\' 8"  (1.727 m)   Wt 160 lb (72.6 kg)   SpO2 100%   BMI 24.33 kg/m   Wt Readings from Last 3 Encounters:  08/30/22 160 lb (72.6 kg)  08/31/21 158 lb (71.7 kg)  06/23/21 162 lb (73.5 kg)    Physical Exam Vitals reviewed.  Constitutional:      General: He is not in acute distress.    Appearance: He is well-developed. He is not diaphoretic.  HENT:     Right Ear: External  ear normal.     Left Ear: External ear normal.     Nose: Nose normal.     Mouth/Throat:     Pharynx: No oropharyngeal exudate.  Eyes:     General: No scleral icterus.       Right eye: No discharge.     Conjunctiva/sclera: Conjunctivae normal.     Pupils: Pupils are equal, round, and reactive to light.  Neck:     Thyroid: No thyromegaly.  Cardiovascular:     Rate and Rhythm: Normal rate and regular rhythm.     Heart sounds: Normal heart sounds. No murmur heard. Pulmonary:     Effort: Pulmonary effort is normal. No respiratory distress.     Breath sounds: Normal breath sounds. No wheezing.  Abdominal:     General: Bowel sounds are normal. There is no distension.     Palpations: Abdomen is soft.     Tenderness: There is no abdominal tenderness. There is  no guarding or rebound.  Musculoskeletal:        General: Normal range of motion.     Cervical back: Neck supple.  Lymphadenopathy:     Cervical: No cervical adenopathy.  Skin:    General: Skin is warm and dry.     Findings: No rash.  Neurological:     Mental Status: He is alert and oriented to person, place, and time.     Coordination: Coordination normal.  Psychiatric:        Behavior: Behavior normal.       Assessment & Plan:   Problem List Items Addressed This Visit       Other   Dysgraphia (Chronic)   ADHD (attention deficit hyperactivity disorder), combined type   Relevant Medications   atomoxetine (STRATTERA) 60 MG capsule   Other Visit Diagnoses     Physical exam    -  Primary   Potential exposure to STD       Relevant Orders   RPR   HepB+HepC+HIV Panel   Urine cytology ancillary only   HSV 1 and 2 Ab, IgG     Doing well on the Strattera, will go ahead and fill that, see back in 6 months.  Follow up plan: Return in about 6 months (around 03/02/2023), or if symptoms worsen or fail to improve, for ADHD.  Counseling provided for all of the vaccine components Orders Placed This Encounter  Procedures   RPR   HepB+HepC+HIV Panel   HSV 1 and 2 Ab, IgG    Caryl Pina, MD [facility] 08/30/2022, 9:56 AM

## 2022-08-31 LAB — HEPB+HEPC+HIV PANEL
HIV Screen 4th Generation wRfx: NONREACTIVE
Hep B C IgM: NEGATIVE
Hep B Core Total Ab: NEGATIVE
Hep B E Ab: NEGATIVE
Hep B E Ag: NEGATIVE
Hep B Surface Ab, Qual: REACTIVE
Hep C Virus Ab: NONREACTIVE
Hepatitis B Surface Ag: NEGATIVE

## 2022-08-31 LAB — HSV 1 AND 2 AB, IGG
HSV 1 Glycoprotein G Ab, IgG: 16.3 index — ABNORMAL HIGH (ref 0.00–0.90)
HSV 2 IgG, Type Spec: <0.91 index (ref 0.00–0.90)

## 2022-08-31 LAB — RPR: RPR Ser Ql: NONREACTIVE

## 2022-09-04 LAB — URINE CYTOLOGY ANCILLARY ONLY
Chlamydia: NEGATIVE
Comment: NEGATIVE
Comment: NORMAL
Neisseria Gonorrhea: NEGATIVE

## 2022-10-01 ENCOUNTER — Telehealth: Payer: Self-pay | Admitting: Family Medicine

## 2022-10-01 NOTE — Telephone Encounter (Signed)
Yes that is fine to go ahead and do a note saying he is okay to drive with Strattera, I have never heard of any problems with Strattera and driving

## 2022-10-01 NOTE — Telephone Encounter (Signed)
Pt made aware. He will print from [place]. Will call back if has any concerns.

## 2022-10-01 NOTE — Telephone Encounter (Signed)
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   Please call back when the note is ready to be picked up.

## 2023-03-04 ENCOUNTER — Ambulatory Visit: Payer: BC Managed Care – PPO | Admitting: Family Medicine

## 2023-03-15 ENCOUNTER — Encounter: Payer: Self-pay | Admitting: Family Medicine

## 2023-03-15 ENCOUNTER — Ambulatory Visit: Payer: BC Managed Care – PPO | Admitting: Family Medicine

## 2023-03-15 VITALS — BP 111/73 | HR 58 | Ht 68.0 in | Wt 159.0 lb

## 2023-03-15 DIAGNOSIS — F902 Attention-deficit hyperactivity disorder, combined type: Secondary | ICD-10-CM

## 2023-03-15 DIAGNOSIS — R278 Other lack of coordination: Secondary | ICD-10-CM

## 2023-03-15 MED ORDER — ATOMOXETINE HCL 60 MG PO CAPS
60.0000 mg | ORAL_CAPSULE | Freq: Every day | ORAL | 1 refills | Status: DC
Start: 2023-03-15 — End: 2023-09-13

## 2023-03-15 NOTE — Progress Notes (Signed)
BP 111/73   Pulse (!) 58   Ht 5\' 8"  (1.727 m)   Wt 159 lb (72.1 kg)   SpO2 100%   BMI 24.18 kg/m    Subjective:   Patient ID: Antonio Rice, male    DOB: Apr 13, 1996, 27 y.o.   MRN: [id number]  HPI: Antonio Rice is a 27 y.o. male presenting on 03/15/2023 for Medical Management of Chronic Issues   HPI ADHD recheck Patient is coming in today for ADHD and attention focused recheck.  He is currently taking Strattera 60 mg.  Patient is coming in and feels that the Wilhemena Durie is still helping and doing well and denies any major side effects.  He denies any mood swings or suicidal ideations.  Relevant past medical, surgical, family and social history reviewed and updated as indicated. Interim medical history since our last visit reviewed. Allergies and medications reviewed and updated.  Review of Systems  Constitutional:  Negative for chills and fever.  Eyes:  Negative for visual disturbance.  Respiratory:  Negative for shortness of breath and wheezing.   Cardiovascular:  Negative for chest pain and leg swelling.  Musculoskeletal:  Negative for back pain and gait problem.  Skin:  Negative for rash.  Neurological:  Negative for dizziness, weakness and light-headedness.  All other systems reviewed and are negative.   Per HPI unless specifically indicated above   Allergies as of 03/15/2023   No Known Allergies      Medication List        Accurate as of March 15, 2023  8:40 AM. If you have any questions, ask your nurse or doctor.          Athletes Foot 2 % powder Generic drug: miconazole Apply topically as needed for itching. What changed: how much to take   atomoxetine 60 MG capsule Commonly known as: STRATTERA Take 1 capsule (60 mg total) by mouth daily.   cetirizine 10 MG tablet Commonly known as: ZYRTEC Take 10 mg by mouth daily as needed for allergies.         Objective:   BP 111/73   Pulse (!) 58   Ht 5\' 8"  (1.727 m)   Wt 159 lb (72.1 kg)    SpO2 100%   BMI 24.18 kg/m   Wt Readings from Last 3 Encounters:  03/15/23 159 lb (72.1 kg)  08/30/22 160 lb (72.6 kg)  06/23/21 162 lb (73.5 kg)    Physical Exam Vitals and nursing note reviewed.  Constitutional:      General: He is not in acute distress.    Appearance: He is well-developed. He is not diaphoretic.  Eyes:     General: No scleral icterus.    Conjunctiva/sclera: Conjunctivae normal.  Neck:     Thyroid: No thyromegaly.  Cardiovascular:     Rate and Rhythm: Normal rate and regular rhythm.     Heart sounds: Normal heart sounds. No murmur heard. Pulmonary:     Effort: Pulmonary effort is normal. No respiratory distress.     Breath sounds: Normal breath sounds. No wheezing.  Musculoskeletal:        General: No swelling. Normal range of motion.     Cervical back: Neck supple.  Lymphadenopathy:     Cervical: No cervical adenopathy.  Skin:    General: Skin is warm and dry.     Findings: No rash.  Neurological:     Mental Status: He is alert and oriented to person, place, and time.     Coordination:  Coordination normal.  Psychiatric:        Behavior: Behavior normal.       Assessment & Plan:   Problem List Items Addressed This Visit       Other   Dysgraphia - Primary (Chronic)   ADHD (attention deficit hyperactivity disorder), combined type   Relevant Medications   atomoxetine (STRATTERA) 60 MG capsule    Continue current medicine, seems to be doing well.  No changes. Follow up plan: Return in about 6 months (around 09/13/2023), or if symptoms worsen or fail to improve, for Physical exam recheck ADHD.  Counseling provided for all of the vaccine components No orders of the defined types were placed in this encounter.   Arville Care, MD [facility] 03/15/2023, 8:40 AM

## 2023-07-21 DIAGNOSIS — R059 Cough, unspecified: Secondary | ICD-10-CM | POA: Diagnosis not present

## 2023-07-21 DIAGNOSIS — R509 Fever, unspecified: Secondary | ICD-10-CM | POA: Diagnosis not present

## 2023-07-21 DIAGNOSIS — J101 Influenza due to other identified influenza virus with other respiratory manifestations: Secondary | ICD-10-CM | POA: Diagnosis not present

## 2023-08-08 ENCOUNTER — Encounter: Payer: Self-pay | Admitting: Family Medicine

## 2023-08-08 ENCOUNTER — Other Ambulatory Visit (HOSPITAL_COMMUNITY)
Admission: RE | Admit: 2023-08-08 | Discharge: 2023-08-08 | Disposition: A | Discharge status: Home / Self Care | Source: Ambulatory Visit | Attending: Family Medicine | Admitting: Family Medicine

## 2023-08-08 ENCOUNTER — Ambulatory Visit: Payer: BC Managed Care – PPO | Admitting: Family Medicine

## 2023-08-08 VITALS — BP 137/79 | HR 107 | Wt 149.0 lb

## 2023-08-08 DIAGNOSIS — N50819 Testicular pain, unspecified: Secondary | ICD-10-CM | POA: Insufficient documentation

## 2023-08-08 DIAGNOSIS — R369 Urethral discharge, unspecified: Secondary | ICD-10-CM

## 2023-08-08 NOTE — Progress Notes (Signed)
 BP 137/79   Pulse (!) 107   Wt 149 lb (67.6 kg)   SpO2 98%   BMI 22.66 kg/m    Subjective:   Patient ID: Antonio Rice, male    DOB: 1996/03/26, 28 y.o.   MRN: [id number]  HPI: Antonio Rice is a 28 y.o. male presenting on 08/08/2023 for Testicle Pain and Penile Discharge   HPI Patient is coming in today planing of penile discharge it has been intermittent off and on over the past few years.  He said the most recent episode started about a week ago and he started with some thick white milky discharge from the end of his penis and then he gets some testicular throbbing and pain associated with it.  He says it hurts more with urination and his discharge comes after urination.  He has not noticed any issues with pain or irritation with intercourse.  He has been with his same sexual partner for almost 4 years.  He is engaged to his fiance.  She has not noticed any issues with discharge or pain or irritation from  Relevant past medical, surgical, family and social history reviewed and updated as indicated. Interim medical history since our last visit reviewed. Allergies and medications reviewed and updated.  Review of Systems  Constitutional:  Negative for chills and fever.  Respiratory:  Negative for shortness of breath and wheezing.   Cardiovascular:  Negative for chest pain and leg swelling.  Genitourinary:  Positive for penile discharge and testicular pain. Negative for penile pain, penile swelling and scrotal swelling.  Musculoskeletal:  Negative for back pain and gait problem.  Skin:  Negative for color change, rash and wound.  All other systems reviewed and are negative.   Per HPI unless specifically indicated above   Allergies as of 08/08/2023   No Known Allergies      Medication List        Accurate as of August 08, 2023  4:27 PM. If you have any questions, ask your nurse or doctor.          Athletes Foot 2 % powder Generic drug: miconazole Apply  topically as needed for itching. What changed: how much to take   atomoxetine 60 MG capsule Commonly known as: STRATTERA Take 1 capsule (60 mg total) by mouth daily.   cetirizine 10 MG tablet Commonly known as: ZYRTEC Take 10 mg by mouth daily as needed for allergies.         Objective:   BP 137/79   Pulse (!) 107   Wt 149 lb (67.6 kg)   SpO2 98%   BMI 22.66 kg/m   Wt Readings from Last 3 Encounters:  08/08/23 149 lb (67.6 kg)  03/15/23 159 lb (72.1 kg)  08/30/22 160 lb (72.6 kg)    Physical Exam Vitals and nursing note reviewed.  Constitutional:      Appearance: Normal appearance.  Genitourinary:    Penis: Normal and circumcised. No tenderness or discharge (No discharge noted on exam).      Testes: Normal. Cremasteric reflex is present.        Right: Mass, tenderness or swelling not present. Right testis is descended. Cremasteric reflex is present.         Left: Mass, tenderness or swelling not present. Left testis is descended. Cremasteric reflex is present.      Epididymis:     Right: Normal.     Left: Normal.  Neurological:     Mental Status: He is alert.  Assessment & Plan:   Problem List Items Addressed This Visit   None Visit Diagnoses       Penile discharge    -  Primary   Relevant Orders   Cytology (oral, anal, urethral) ancillary only   US Scrotum   Urinalysis, Complete   Urine Culture     Pain in testicle, unspecified laterality       Relevant Orders   Cytology (oral, anal, urethral) ancillary only   US Scrotum   Urinalysis, Complete   Urine Culture       Will order ultrasound scrotum when having to urinate in a cup.  Also did penile swab.  Did not notice a lot of discharge on exam.  Will order an ultrasound as well.   Follow up plan: Return if symptoms worsen or fail to improve.  Counseling provided for all of the vaccine components Orders Placed This Encounter  Procedures   Urine Culture   US Scrotum   Urinalysis,  Complete    Arville Care, MD [place] [facility] 08/08/2023, 4:27 PM

## 2023-08-09 ENCOUNTER — Ambulatory Visit: Payer: Self-pay | Admitting: Family Medicine

## 2023-08-09 DIAGNOSIS — R369 Urethral discharge, unspecified: Secondary | ICD-10-CM

## 2023-08-09 LAB — URINALYSIS, COMPLETE
Bilirubin, UA: NEGATIVE
Glucose, UA: NEGATIVE
Leukocytes,UA: NEGATIVE
Nitrite, UA: NEGATIVE
RBC, UA: NEGATIVE
Specific Gravity, UA: >1.03 — ABNORMAL HIGH (ref 1.005–1.030)
Urobilinogen, Ur: 0.2 mg/dL (ref 0.2–1.0)
pH, UA: 6 (ref 5.0–7.5)

## 2023-08-09 NOTE — Telephone Encounter (Signed)
  Chief Complaint: Lab results Additional Notes: Patient called in requesting lab results for a urinalysis done on 08/08/23. This RN informed patient that the provider has not dictated on the results yet and to call back midday Monday if the office does not reach out prior to. Patient verbalized understanding.  Copied from CRM [id number]. Topic: Clinical - Lab/Test Results >> Aug 09, 2023  4:40 PM Martha Clan wrote: Reason for CRM: Patient saw urine culture results but there are no notes for the PCP, Patient is looking for clarifications on test. Reason for Disposition  Caller requesting routine or non-urgent lab result  Answer Assessment - Initial Assessment Questions 1. REASON FOR CALL or QUESTION: "What is your reason for calling today?" or "How can I best help you?" or "What question do you have that I can help answer?"     "I just want to know my lab results."  Protocols used: Information Only Call - No Triage-A-AH, PCP Call - No Triage-A-AH

## 2023-08-10 LAB — URINE CULTURE

## 2023-08-12 ENCOUNTER — Encounter: Payer: Self-pay | Admitting: Family Medicine

## 2023-08-12 NOTE — Telephone Encounter (Signed)
 Pt made aware and understood. He is ok for referral to urology in [place]. Informed that urethral swab has not resulted yet. Referral placed.

## 2023-08-12 NOTE — Addendum Note (Signed)
 Addended by: Dorene Sorrow on: 08/12/2023 08:48 AM   Modules accepted: Orders

## 2023-08-12 NOTE — Telephone Encounter (Signed)
 Please let the patient know that so far the urine culture results have not grown anything significant.  Please place referral to urology for him, diagnosis penile discharge

## 2023-08-13 LAB — CYTOLOGY, (ORAL, ANAL, URETHRAL) ANCILLARY ONLY
Chlamydia: NEGATIVE
Comment: NEGATIVE
Comment: NEGATIVE
Comment: NORMAL
Neisseria Gonorrhea: NEGATIVE
Trichomonas: NEGATIVE

## 2023-08-14 ENCOUNTER — Other Ambulatory Visit: Payer: Self-pay | Admitting: Family Medicine

## 2023-08-14 DIAGNOSIS — N50819 Testicular pain, unspecified: Secondary | ICD-10-CM

## 2023-08-14 DIAGNOSIS — R369 Urethral discharge, unspecified: Secondary | ICD-10-CM

## 2023-08-14 NOTE — Progress Notes (Signed)
Ordered scrotal ultrasound

## 2023-08-19 ENCOUNTER — Encounter: Payer: Self-pay | Admitting: Family Medicine

## 2023-08-19 ENCOUNTER — Ambulatory Visit (HOSPITAL_COMMUNITY)
Admission: RE | Admit: 2023-08-19 | Discharge: 2023-08-19 | Disposition: A | Discharge status: Home / Self Care | Source: Ambulatory Visit | Attending: Family Medicine | Admitting: Family Medicine

## 2023-08-19 DIAGNOSIS — N50819 Testicular pain, unspecified: Secondary | ICD-10-CM | POA: Insufficient documentation

## 2023-08-19 DIAGNOSIS — R369 Urethral discharge, unspecified: Secondary | ICD-10-CM | POA: Insufficient documentation

## 2023-09-07 ENCOUNTER — Other Ambulatory Visit: Payer: Self-pay | Admitting: Family Medicine

## 2023-09-07 DIAGNOSIS — F902 Attention-deficit hyperactivity disorder, combined type: Secondary | ICD-10-CM

## 2023-09-13 ENCOUNTER — Ambulatory Visit: Payer: BC Managed Care – PPO | Admitting: Family Medicine

## 2023-09-13 ENCOUNTER — Encounter: Payer: Self-pay | Admitting: Family Medicine

## 2023-09-13 VITALS — BP 119/81 | HR 81 | Ht 68.0 in | Wt 150.0 lb

## 2023-09-13 DIAGNOSIS — F902 Attention-deficit hyperactivity disorder, combined type: Secondary | ICD-10-CM

## 2023-09-13 DIAGNOSIS — Z0000 Encounter for general adult medical examination without abnormal findings: Secondary | ICD-10-CM

## 2023-09-13 DIAGNOSIS — Z0001 Encounter for general adult medical examination with abnormal findings: Secondary | ICD-10-CM

## 2023-09-13 MED ORDER — ATOMOXETINE HCL 60 MG PO CAPS: 60.0000 mg | ORAL_CAPSULE | Freq: Every day | ORAL | 3 refills | Status: DC

## 2023-09-13 NOTE — Progress Notes (Signed)
 BP 119/81   Pulse 81   Ht 5\' 8"  (1.727 m)   Wt 150 lb (68 kg)   SpO2 97%   BMI 22.81 kg/m    Subjective:   Patient ID: Antonio Rice, male    DOB: [birth year], 28 y.o.   MRN: [id number]  HPI: Antonio Rice is a 28 y.o. male presenting on 09/13/2023 for Medical Management of Chronic Issues (CPE)   HPI Physical exam Patient denies any chest pain, shortness of breath, headaches or vision issues, abdominal complaints, diarrhea, nausea, vomiting, or joint issues.   Patient is coming in today for ADHD recheck.  He has been on the Strattera for years has helped him focus and keeps him on task any side effects from them.  Relevant past medical, surgical, family and social history reviewed and updated as indicated. Interim medical history since our last visit reviewed. Allergies and medications reviewed and updated.  Review of Systems  Constitutional:  Negative for chills and fever.  HENT:  Negative for ear pain and tinnitus.   Eyes:  Negative for pain and visual disturbance.  Respiratory:  Negative for cough, shortness of breath and wheezing.   Cardiovascular:  Negative for chest pain, palpitations and leg swelling.  Gastrointestinal:  Negative for abdominal pain, blood in stool, constipation and diarrhea.  Genitourinary:  Negative for dysuria and hematuria.  Musculoskeletal:  Negative for back pain, gait problem and myalgias.  Skin:  Negative for rash.  Neurological:  Negative for dizziness, weakness and headaches.  Psychiatric/Behavioral:  Negative for suicidal ideas.   All other systems reviewed and are negative.   Per HPI unless specifically indicated above   Allergies as of 09/13/2023   No Known Allergies      Medication List        Accurate as of September 13, 2023 10:16 AM. If you have any questions, ask your nurse or doctor.          Athletes Foot 2 % powder Generic drug: miconazole Apply topically as needed for itching. What changed: how much to take    atomoxetine 60 MG capsule Commonly known as: STRATTERA Take 1 capsule (60 mg total) by mouth daily.   cetirizine 10 MG tablet Commonly known as: ZYRTEC Take 10 mg by mouth daily as needed for allergies.         Objective:   BP 119/81   Pulse 81   Ht 5\' 8"  (1.727 m)   Wt 150 lb (68 kg)   SpO2 97%   BMI 22.81 kg/m   Wt Readings from Last 3 Encounters:  09/13/23 150 lb (68 kg)  08/08/23 149 lb (67.6 kg)  03/15/23 159 lb (72.1 kg)    Physical Exam Vitals reviewed.  Constitutional:      General: He is not in acute distress.    Appearance: He is well-developed. He is not diaphoretic.  HENT:     Right Ear: External ear normal.     Left Ear: External ear normal.     Nose: Nose normal.     Mouth/Throat:     Pharynx: No oropharyngeal exudate.  Eyes:     General: No scleral icterus.    Conjunctiva/sclera: Conjunctivae normal.  Neck:     Thyroid: No thyromegaly.  Cardiovascular:     Rate and Rhythm: Normal rate and regular rhythm.     Heart sounds: Normal heart sounds. No murmur heard. Pulmonary:     Effort: Pulmonary effort is normal. No respiratory distress.  Breath sounds: Normal breath sounds. No wheezing.  Abdominal:     General: Bowel sounds are normal. There is no distension.     Palpations: Abdomen is soft.     Tenderness: There is no abdominal tenderness. There is no guarding or rebound.  Musculoskeletal:        General: No swelling. Normal range of motion.     Cervical back: Neck supple.  Lymphadenopathy:     Cervical: No cervical adenopathy.  Skin:    General: Skin is warm and dry.     Findings: No rash.  Neurological:     Mental Status: He is alert and oriented to person, place, and time.     Coordination: Coordination normal.  Psychiatric:        Behavior: Behavior normal.       Assessment & Plan:   Problem List Items Addressed This Visit       Other   ADHD (attention deficit hyperactivity disorder), combined type   Relevant  Medications   atomoxetine (STRATTERA) 60 MG capsule   Other Visit Diagnoses       Physical exam    -  Primary       Seems to be doing well, having intermittent testicular issues and has an appointment with urology on the 21st in [place]. Follow up plan: Return in about 1 year (around 09/12/2024), or if symptoms worsen or fail to improve, for phjysical.  Counseling provided for all of the vaccine components No orders of the defined types were placed in this encounter.   Arville Care, MD [facility] 09/13/2023, 10:16 AM

## 2023-09-30 ENCOUNTER — Ambulatory Visit: Admitting: Urology

## 2023-09-30 VITALS — BP 117/81 | HR 93

## 2023-09-30 DIAGNOSIS — R102 Pelvic and perineal pain: Secondary | ICD-10-CM | POA: Diagnosis not present

## 2023-09-30 DIAGNOSIS — R369 Urethral discharge, unspecified: Secondary | ICD-10-CM

## 2023-09-30 LAB — URINALYSIS, ROUTINE W REFLEX MICROSCOPIC
Bilirubin, UA: NEGATIVE
Glucose, UA: NEGATIVE
Ketones, UA: NEGATIVE
Leukocytes,UA: NEGATIVE
Nitrite, UA: NEGATIVE
Protein,UA: NEGATIVE
RBC, UA: NEGATIVE
Specific Gravity, UA: 1.02 (ref 1.005–1.030)
Urobilinogen, Ur: 2 mg/dL — ABNORMAL HIGH (ref 0.2–1.0)
pH, UA: 7 (ref 5.0–7.5)

## 2023-09-30 MED ORDER — DOXYCYCLINE HYCLATE 100 MG PO CAPS: 100.0000 mg | ORAL_CAPSULE | Freq: Two times a day (BID) | ORAL | 0 refills | Status: DC

## 2023-09-30 MED ORDER — METRONIDAZOLE 500 MG PO TABS
2000.0000 mg | ORAL_TABLET | Freq: Once | ORAL | 0 refills | Status: AC
Start: 2023-09-30 — End: 2023-09-30

## 2023-09-30 NOTE — Progress Notes (Signed)
 09/30/2023 10:34 AM   Antonio Rice 27-Jul-1995 [id number]  Referring provider: Dettinger, Lucio Sabin, MD [address] [place],  [place] [address]  No chief complaint on file.   HPI:  New pt -   1) penile discharge - noted since 2022. Occasional gel ike discharge and sometimes more milky. This was time when he was more sexually active, but did note it yesterday. Occasional testicle discomfort. Doesn't seem to be related to urethral issue. No hip issues except low back pain p work. He is a Engineer, civil (consulting). No GU, scrotal or inguinal surgery. Feb 2025 GC, Ch, Trich negative. HSV + - h/o cold sores. Hep neg, RPR neg. Urine cx mixed. He recalls two other STI tests that were negative.    PMH: Past Medical History:  Diagnosis Date   ADHD (attention deficit hyperactivity disorder)     Surgical History: Past Surgical History:  Procedure Laterality Date   TONSILLECTOMY      Home Medications:  Allergies as of 09/30/2023   No Known Allergies      Medication List        Accurate as of September 30, 2023 10:34 AM. If you have any questions, ask your nurse or doctor.          Athletes Foot 2 % powder Generic drug: miconazole Apply topically as needed for itching. What changed: how much to take   atomoxetine  60 MG capsule Commonly known as: STRATTERA  Take 1 capsule (60 mg total) by mouth daily.   cetirizine  10 MG tablet Commonly known as: ZYRTEC  Take 10 mg by mouth daily as needed for allergies.        Allergies: No Known Allergies  Family History: Family History  Problem Relation Age of Onset   Cancer Brother 51       undifferentiated epitheliod malignancy high grade sarcoma    Social History:  reports that he has never smoked. He has never used smokeless tobacco. He reports that he does not drink alcohol and does not use drugs.   Physical Exam: BP 117/81   Pulse 93   Constitutional:  Alert and oriented, No acute distress. HEENT: [place] AT, moist mucus  membranes.  Trachea midline, no masses. Cardiovascular: No clubbing, cyanosis, or edema. Respiratory: Normal respiratory effort, no increased work of breathing. GI: Abdomen is soft, nontender, nondistended, no abdominal masses GU: No CVA tenderness Lymph: No cervical or inguinal lymphadenopathy. Skin: No rashes, bruises or suspicious lesions. Neurologic: Grossly intact, no focal deficits, moving all 4 extremities. Psychiatric: Normal mood and affect. GU: Penis circumcised, glans and meatus appear normal - no discharge, normal foreskin, testicles descended bilaterally and palpably normal (easy, reliable exam), bilateral epididymis palpably normal, scrotum normal    Laboratory Data: No results found for: "WBC", "HGB", "HCT", "MCV", "PLT"  No results found for: "CREATININE"  No results found for: "PSA"  No results found for: "TESTOSTERONE"  No results found for: "HGBA1C"  Urinalysis    Component Value Date/Time   APPEARANCEUR Cloudy (A) 08/08/2023 1631   GLUCOSEU Negative 08/08/2023 1631   BILIRUBINUR Negative 08/08/2023 1631   PROTEINUR 2+ (A) 08/08/2023 1631   NITRITE Negative 08/08/2023 1631   LEUKOCYTESUR Negative 08/08/2023 1631    Lab Results  Component Value Date   LABMICR CANCELED 08/08/2023   WBCUA 0-5 01/04/2016   RBCUA None seen 01/04/2016   LABEPIT None seen 01/04/2016   BACTERIA None seen 01/04/2016    Pertinent Imaging:   Assessment & Plan:    1)  testicular pain - exam benign. STI negative. Urethral discharge sound like pre-ejaculatory fluid. All testing negative. Discussed Tx with flagyl  2 g x 1, erythromycin base 500 BID or Doxy BID to tx for atypicals and trich. He will proceed.   No follow-ups on file.  Christina Coyer, MD  [facility]  [address] [place], [place] [address] [phone]

## 2024-04-22 ENCOUNTER — Ambulatory Visit: Payer: Self-pay | Admitting: Family Medicine

## 2024-06-07 ENCOUNTER — Other Ambulatory Visit: Payer: Self-pay | Admitting: Family Medicine

## 2024-06-07 DIAGNOSIS — F902 Attention-deficit hyperactivity disorder, combined type: Secondary | ICD-10-CM

## 2024-07-03 ENCOUNTER — Telehealth: Payer: Self-pay | Admitting: Family Medicine

## 2024-07-03 DIAGNOSIS — J069 Acute upper respiratory infection, unspecified: Secondary | ICD-10-CM

## 2024-07-03 MED ORDER — PREDNISONE 20 MG PO TABS: 20.0000 mg | ORAL_TABLET | Freq: Two times a day (BID) | ORAL | 0 refills | Status: AC

## 2024-07-03 NOTE — Progress Notes (Signed)
 " Virtual Visit Consent   Antonio Rice, you are scheduled for a virtual visit with a [facility] provider today. Just as with appointments in the office, your consent must be obtained to participate. Your consent will be active for this visit and any virtual visit you may have with one of our providers in the next 365 days. If you have a MyChart account, a copy of this consent can be sent to you electronically.  As this is a virtual visit, video technology does not allow for your provider to perform a traditional examination. This may limit your provider's ability to fully assess your condition. If your provider identifies any concerns that need to be evaluated in person or the need to arrange testing (such as labs, EKG, etc.), we will make arrangements to do so. Although advances in technology are sophisticated, we cannot ensure that it will always work on either your end or our end. If the connection with a video visit is poor, the visit may have to be switched to a telephone visit. With either a video or telephone visit, we are not always able to ensure that we have a secure connection.  By engaging in this virtual visit, you consent to the provision of healthcare and authorize for your insurance to be billed (if applicable) for the services provided during this visit. Depending on your insurance coverage, you may receive a charge related to this service.  I need to obtain your verbal consent now. Are you willing to proceed with your visit today? Antonio Rice has provided verbal consent on 07/03/2024 for a virtual visit (video or telephone). Antonio Lamp, FNP  Date: 07/03/2024 12:17 PM   Virtual Visit via Video Note   I, Antonio Rice, connected with  Antonio Rice  ([id number], 1995-10-22) on 07/03/24 at 12:30 PM EST by a video-enabled telemedicine application and verified that I am speaking with the correct person using two identifiers.  Location: Patient: Virtual Visit Location Patient:  Home Provider: Virtual Visit Location Provider: Home Office   I discussed the limitations of evaluation and management by telemedicine and the availability of in person appointments. The patient expressed understanding and agreed to proceed.    History of Present Illness: Antonio Rice is a 29 y.o. who identifies as a male who was assigned male at birth, and is being seen today for cough, sore throat, sinus drainage, no fever, yellow. No wheezing or sob. Has tried mucinex. 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  Resting comfortably  at home.  Head is normocephalic, atraumatic.  No labored breathing.  Speech is clear and coherent with logical content.  Patient is alert and oriented at baseline.    Assessment and Plan: There are no diagnoses linked to this encounter. Increase fluids, humidifier at night, mucinex, UC if sx worsen.   Follow Up Instructions: I discussed the assessment and treatment plan with the patient. The patient was provided an opportunity to ask questions and all were answered. The patient agreed with the plan and demonstrated an understanding of the instructions.  A copy of instructions were sent to the patient via MyChart unless otherwise noted below.     The patient was advised to call back or seek an in-person evaluation  if the symptoms worsen or if the condition fails to improve as anticipated.    Antonio Knobloch, FNP     [1] No Known Allergies [2]  Current Outpatient Medications:    atomoxetine  (STRATTERA ) 60 MG capsule, TAKE 1 CAPSULE BY MOUTH EVERY DAY, Disp: 90 capsule, Rfl: 0   cetirizine  (ZYRTEC ) 10 MG tablet, Take 10 mg by  mouth daily as needed for allergies., Disp: , Rfl:    doxycycline  (VIBRAMYCIN ) 100 MG capsule, Take 1 capsule (100 mg total) by mouth 2 (two) times daily., Disp: 14 capsule, Rfl: 0   miconazole (ATHLETES FOOT) 2 % powder, Apply topically as needed for itching. (Patient taking differently: Apply 1 Application topically as needed for itching.), Disp: 70 g, Rfl: 3  "

## 2024-07-03 NOTE — Patient Instructions (Signed)

## 2024-07-15 ENCOUNTER — Encounter: Payer: Self-pay | Admitting: Family Medicine

## 2024-07-15 ENCOUNTER — Ambulatory Visit: Admitting: Family Medicine

## 2024-07-15 VITALS — BP 122/86 | HR 100 | Ht 68.0 in | Wt 143.0 lb

## 2024-07-15 DIAGNOSIS — F410 Panic disorder [episodic paroxysmal anxiety] without agoraphobia: Secondary | ICD-10-CM

## 2024-07-15 DIAGNOSIS — F902 Attention-deficit hyperactivity disorder, combined type: Secondary | ICD-10-CM

## 2024-07-15 MED ORDER — HYDROXYZINE PAMOATE 25 MG PO CAPS: 25.0000 mg | ORAL_CAPSULE | Freq: Three times a day (TID) | ORAL | 0 refills | Status: AC | PRN

## 2024-07-15 MED ORDER — ATOMOXETINE HCL 60 MG PO CAPS: 60.0000 mg | ORAL_CAPSULE | Freq: Every day | ORAL | 1 refills | Status: AC

## 2024-07-15 NOTE — Progress Notes (Signed)
 "  BP 122/86   Pulse 100   Ht 5' 8 (1.727 m)   Wt 143 lb (64.9 kg)   SpO2 98%   BMI 21.74 kg/m    Subjective:   Patient ID: Antonio Rice, male    DOB: [birth year], 29 y.o.   MRN: [id number]  HPI: Antonio Rice is a 29 y.o. male presenting on 07/15/2024 for Anxiety (Believes he may have had a panic attack after taking Prednisone  and Sudafed )   Discussed the use of AI scribe software for clinical note transcription with the patient, who gave verbal consent to proceed.  History of Present Illness   Antonio Rice is a 29 year old male who presents with chest pain and shortness of breath following recent prednisone  use.  Chest pain and dyspnea - Onset since last Thursday after last dose of prednisone , prescribed for mucus congestion - Chest pain radiates to abdomen, associated with tightness and difficulty breathing - Initial episode severe enough to prompt EMS call and EKG evaluation - Intermittent 'fluttery' chest sensations and shortness of breath persist, especially at rest or while sitting - No symptoms triggered by physical activity; symptoms occur primarily at rest - No associated belching, burping, or indigestion  Medication and substance use - Recent use of prednisone  for mucus congestion, last dose taken last Thursday - Sudafed taken on same day as last prednisone  dose for nasal congestion; not used since episode - Delta-9 gummies not used since episode - Strattera  taken daily except Sundays - High caffeine intake: two to three Sun Drop sodas daily, with efforts to reduce consumption  Sleep disturbance - Sleep was typically good prior to recent prednisone  use - Sleep quality affected since prednisone  use  Psychosocial factors - No recent increase in stress or significant life changes aside from acquiring a new dog, which is not perceived as stressful  Symptom history - Similar episode approximately four years ago involving shoulder pain and heavy breathing,  but recent episode more severe          Relevant past medical, surgical, family and social history reviewed and updated as indicated. Interim medical history since our last visit reviewed. Allergies and medications reviewed and updated.  Review of Systems  Constitutional:  Negative for chills and fever.  Eyes:  Negative for discharge.  Respiratory:  Negative for shortness of breath and wheezing.   Cardiovascular:  Positive for chest pain and palpitations. Negative for leg swelling.  Skin:  Negative for rash.  Psychiatric/Behavioral:  Positive for dysphoric mood and sleep disturbance. Negative for self-injury and suicidal ideas. The patient is nervous/anxious.   All other systems reviewed and are negative.   Per HPI unless specifically indicated above   Allergies as of 07/15/2024   No Known Allergies      Medication List        Accurate as of July 15, 2024  4:12 PM. If you have any questions, ask your nurse or doctor.          STOP taking these medications    cetirizine  10 MG tablet Commonly known as: ZYRTEC    doxycycline  100 MG capsule Commonly known as: VIBRAMYCIN        TAKE these medications    Athletes Foot 2 % powder Generic drug: miconazole Apply topically as needed for itching.   atomoxetine  60 MG capsule Commonly known as: STRATTERA  Take 1 capsule (60 mg total) by mouth daily. What changed: how much to take   hydrOXYzine  25 MG capsule Commonly known as: VISTARIL   Take 1 capsule (25 mg total) by mouth every 8 (eight) hours as needed.         Objective:   BP 122/86   Pulse 100   Ht 5' 8 (1.727 m)   Wt 143 lb (64.9 kg)   SpO2 98%   BMI 21.74 kg/m   Wt Readings from Last 3 Encounters:  07/15/24 143 lb (64.9 kg)  09/13/23 150 lb (68 kg)  08/08/23 149 lb (67.6 kg)    Physical Exam Vitals and nursing note reviewed.  Abdominal:     General: Abdomen is flat. Bowel sounds are normal. There is no distension.     Palpations: Abdomen is  soft.     Tenderness: There is no abdominal tenderness. There is no guarding or rebound.    Physical Exam   CHEST: Lungs clear to auscultation bilaterally. CARDIOVASCULAR: Heart sounds normal.         Assessment & Plan:   Problem List Items Addressed This Visit       Other   ADHD (attention deficit hyperactivity disorder), combined type   Relevant Medications   atomoxetine  (STRATTERA ) 60 MG capsule   Other Visit Diagnoses       Anxiety attack    -  Primary   Relevant Medications   hydrOXYzine  (VISTARIL ) 25 MG capsule          Anxiety disorder Exacerbation likely due to prednisone . Symptoms include chest pain, abdominal tightness, and shortness of breath. Normal EKG. No reflux or cardiac issues. Expected resolution as prednisone  effects subside. - Prescribed medication for acute anxiety episodes. Cautioned about drowsiness and advised against driving or operating heavy machinery. - Advised symptom monitoring for 1-2 weeks and reporting via MyChart. - Discussed potential need for daily anxiety medication if symptoms persist.          Follow up plan: Return in about 3 months (around 10/12/2024), or if symptoms worsen or fail to improve, for ADHD and anxiety.  Counseling provided for all of the vaccine components No orders of the defined types were placed in this encounter.   Fonda Levins, MD [facility] 07/15/2024, 4:12 PM     "

## 2024-07-15 NOTE — Patient Instructions (Addendum)
 Prednisone  has similar side effects to anxiety and panic attacks  Delta-8 THC gummies, while often marketed as a milder, legal alternative to traditional marijuana, carry significant risks and side effects, particularly because they are largely unregulated and often produced in uncontrolled settings. Side effects range from mild to severe, including intense anxiety, panic attacks, and, in some cases, hospitalization.  Common and Acute Side Effects Psychological Effects: Anxiety, panic attacks, paranoia, and confusion are common, particularly at higher doses. Physical Effects: Nausea, vomiting, diarrhea, dry mouth, red eyes, and dizziness. Cognitive & Motor Impairment: Trouble concentrating, loss of coordination, slurred speech, lethargy, and memory loss. Cardiovascular Effects: Rapid heart rate (tachycardia) or, less commonly, slow heart rate (bradycardia) and changes in blood pressure.

## 2024-09-18 ENCOUNTER — Ambulatory Visit: Payer: Self-pay

## 2024-09-18 ENCOUNTER — Encounter: Admitting: Family Medicine
